# Patient Record
Sex: Female | Born: 1950 | Race: White | Hispanic: No | Marital: Married | Smoking: Former smoker
Health system: Southern US, Community
[De-identification: ages and names within clinical notes are randomized; demographics above are authoritative.]

## PROBLEM LIST (undated history)

## (undated) DIAGNOSIS — D249 Benign neoplasm of unspecified breast: Secondary | ICD-10-CM

## (undated) DIAGNOSIS — E785 Hyperlipidemia, unspecified: Secondary | ICD-10-CM

## (undated) DIAGNOSIS — E119 Type 2 diabetes mellitus without complications: Secondary | ICD-10-CM

## (undated) HISTORY — PX: HIP ARTHROSCOPY: SUR88

## (undated) HISTORY — PX: EYE SURGERY: SHX253

## (undated) HISTORY — PX: TUBAL LIGATION: SHX77

---

## 1987-10-20 HISTORY — PX: BREAST EXCISIONAL BIOPSY: SUR124

## 1998-09-25 ENCOUNTER — Other Ambulatory Visit: Admission: RE | Admit: 1998-09-25 | Discharge: 1998-09-25 | Payer: Self-pay | Admitting: *Deleted

## 1998-09-26 ENCOUNTER — Other Ambulatory Visit: Admission: RE | Admit: 1998-09-26 | Discharge: 1998-09-26 | Payer: Self-pay | Admitting: *Deleted

## 2005-10-19 HISTORY — PX: BREAST EXCISIONAL BIOPSY: SUR124

## 2005-12-10 ENCOUNTER — Ambulatory Visit: Payer: Self-pay | Admitting: Family Medicine

## 2005-12-25 ENCOUNTER — Ambulatory Visit: Payer: Self-pay | Admitting: Family Medicine

## 2006-03-04 ENCOUNTER — Ambulatory Visit: Payer: Self-pay | Admitting: Gastroenterology

## 2006-07-28 ENCOUNTER — Ambulatory Visit: Payer: Self-pay | Admitting: General Surgery

## 2006-08-20 ENCOUNTER — Ambulatory Visit: Payer: Self-pay | Admitting: General Surgery

## 2006-12-27 ENCOUNTER — Ambulatory Visit: Payer: Self-pay | Admitting: General Surgery

## 2007-03-14 ENCOUNTER — Ambulatory Visit: Payer: Self-pay | Admitting: Family Medicine

## 2007-12-29 ENCOUNTER — Ambulatory Visit: Payer: Self-pay | Admitting: Family Medicine

## 2008-03-05 ENCOUNTER — Ambulatory Visit: Payer: Self-pay | Admitting: Ophthalmology

## 2008-05-21 ENCOUNTER — Ambulatory Visit: Payer: Self-pay | Admitting: Ophthalmology

## 2009-09-04 ENCOUNTER — Ambulatory Visit: Payer: Self-pay | Admitting: Family Medicine

## 2010-12-03 ENCOUNTER — Ambulatory Visit: Payer: Self-pay | Admitting: Family Medicine

## 2013-02-13 ENCOUNTER — Inpatient Hospital Stay: Payer: Self-pay | Admitting: Specialist

## 2013-02-13 LAB — CBC
HCT: 41.1 % (ref 35.0–47.0)
HGB: 14.1 g/dL (ref 12.0–16.0)
MCH: 30.4 pg (ref 26.0–34.0)
MCHC: 34.4 g/dL (ref 32.0–36.0)
Platelet: 287 10*3/uL (ref 150–440)
RBC: 4.66 10*6/uL (ref 3.80–5.20)
RDW: 12.7 % (ref 11.5–14.5)

## 2013-02-13 LAB — BASIC METABOLIC PANEL
Calcium, Total: 9.3 mg/dL (ref 8.5–10.1)
Chloride: 102 mmol/L (ref 98–107)
Co2: 26 mmol/L (ref 21–32)
Glucose: 139 mg/dL — ABNORMAL HIGH (ref 65–99)
Potassium: 4.3 mmol/L (ref 3.5–5.1)
Sodium: 137 mmol/L (ref 136–145)

## 2013-02-13 LAB — PROTIME-INR: INR: 1

## 2013-02-14 LAB — CBC WITH DIFFERENTIAL/PLATELET
Basophil %: 0.6 %
Eosinophil #: 0.1 10*3/uL (ref 0.0–0.7)
Eosinophil %: 0.5 %
HGB: 12.6 g/dL (ref 12.0–16.0)
Lymphocyte %: 12.5 %
MCH: 30.8 pg (ref 26.0–34.0)
MCV: 87 fL (ref 80–100)
Monocyte #: 0.6 x10 3/mm (ref 0.2–0.9)
Monocyte %: 5.3 %
Neutrophil #: 9.7 10*3/uL — ABNORMAL HIGH (ref 1.4–6.5)
Platelet: 272 10*3/uL (ref 150–440)
WBC: 11.9 10*3/uL — ABNORMAL HIGH (ref 3.6–11.0)

## 2013-02-14 LAB — BASIC METABOLIC PANEL
Anion Gap: 7 (ref 7–16)
BUN: 15 mg/dL (ref 7–18)
Co2: 27 mmol/L (ref 21–32)
Glucose: 131 mg/dL — ABNORMAL HIGH (ref 65–99)
Osmolality: 273 (ref 275–301)
Potassium: 4.1 mmol/L (ref 3.5–5.1)
Sodium: 135 mmol/L — ABNORMAL LOW (ref 136–145)

## 2013-02-15 LAB — HEMOGLOBIN: HGB: 12.8 g/dL (ref 12.0–16.0)

## 2014-04-03 ENCOUNTER — Ambulatory Visit: Payer: Self-pay | Admitting: Specialist

## 2014-04-18 ENCOUNTER — Ambulatory Visit: Payer: Self-pay | Admitting: Specialist

## 2014-04-18 LAB — URINALYSIS, COMPLETE
Bacteria: NONE SEEN
Bilirubin,UR: NEGATIVE
Blood: NEGATIVE
Glucose,UR: NEGATIVE mg/dL (ref 0–75)
KETONE: NEGATIVE
Nitrite: NEGATIVE
Ph: 6 (ref 4.5–8.0)
Protein: NEGATIVE
RBC,UR: 1 /HPF (ref 0–5)
Specific Gravity: 1.008 (ref 1.003–1.030)
Squamous Epithelial: 6
WBC UR: 7 /HPF (ref 0–5)

## 2014-04-18 LAB — BASIC METABOLIC PANEL
Anion Gap: 10 (ref 7–16)
BUN: 16 mg/dL (ref 7–18)
Calcium, Total: 9.7 mg/dL (ref 8.5–10.1)
Chloride: 102 mmol/L (ref 98–107)
Co2: 25 mmol/L (ref 21–32)
Creatinine: 0.79 mg/dL (ref 0.60–1.30)
EGFR (Non-African Amer.): >60
Glucose: 105 mg/dL — ABNORMAL HIGH (ref 65–99)
OSMOLALITY: 275 (ref 275–301)
POTASSIUM: 3.9 mmol/L (ref 3.5–5.1)
SODIUM: 137 mmol/L (ref 136–145)

## 2014-04-18 LAB — CBC
HCT: 42.3 % (ref 35.0–47.0)
HGB: 14 g/dL (ref 12.0–16.0)
MCH: 29.9 pg (ref 26.0–34.0)
MCHC: 33.1 g/dL (ref 32.0–36.0)
MCV: 90 fL (ref 80–100)
PLATELETS: 310 10*3/uL (ref 150–440)
RBC: 4.69 10*6/uL (ref 3.80–5.20)
RDW: 12.7 % (ref 11.5–14.5)
WBC: 8.4 10*3/uL (ref 3.6–11.0)

## 2014-04-18 LAB — PROTIME-INR
INR: 0.9
Prothrombin Time: 12.3 secs (ref 11.5–14.7)

## 2014-04-18 LAB — MRSA PCR SCREENING

## 2014-04-25 ENCOUNTER — Inpatient Hospital Stay: Payer: Self-pay | Admitting: Specialist

## 2014-04-26 LAB — BASIC METABOLIC PANEL
ANION GAP: 8 (ref 7–16)
BUN: 9 mg/dL (ref 7–18)
CALCIUM: 7.9 mg/dL — AB (ref 8.5–10.1)
Chloride: 103 mmol/L (ref 98–107)
Co2: 25 mmol/L (ref 21–32)
Creatinine: 0.92 mg/dL (ref 0.60–1.30)
EGFR (African American): >60
Glucose: 192 mg/dL — ABNORMAL HIGH (ref 65–99)
Osmolality: 276 (ref 275–301)
Potassium: 4 mmol/L (ref 3.5–5.1)
Sodium: 136 mmol/L (ref 136–145)

## 2014-04-26 LAB — CBC WITH DIFFERENTIAL/PLATELET
BASOS ABS: 0.1 10*3/uL (ref 0.0–0.1)
BASOS PCT: 0.5 %
EOS ABS: 0 10*3/uL (ref 0.0–0.7)
Eosinophil %: 0.1 %
HCT: 31.3 % — ABNORMAL LOW (ref 35.0–47.0)
HGB: 10.5 g/dL — AB (ref 12.0–16.0)
LYMPHS PCT: 8.3 %
Lymphocyte #: 0.9 10*3/uL — ABNORMAL LOW (ref 1.0–3.6)
MCH: 31 pg (ref 26.0–34.0)
MCHC: 33.5 g/dL (ref 32.0–36.0)
MCV: 93 fL (ref 80–100)
MONO ABS: 0.9 x10 3/mm (ref 0.2–0.9)
Monocyte %: 8.3 %
NEUTROS ABS: 9.2 10*3/uL — AB (ref 1.4–6.5)
Neutrophil %: 82.8 %
Platelet: 231 10*3/uL (ref 150–440)
RBC: 3.37 10*6/uL — AB (ref 3.80–5.20)
RDW: 12.6 % (ref 11.5–14.5)
WBC: 11.2 10*3/uL — ABNORMAL HIGH (ref 3.6–11.0)

## 2014-04-27 LAB — PATHOLOGY REPORT

## 2014-04-27 LAB — HEMOGLOBIN: HGB: 10 g/dL — ABNORMAL LOW (ref 12.0–16.0)

## 2014-04-28 LAB — URINALYSIS, COMPLETE
BACTERIA: NONE SEEN
BILIRUBIN, UR: NEGATIVE
Blood: NEGATIVE
GLUCOSE, UR: NEGATIVE mg/dL (ref 0–75)
Ketone: NEGATIVE
Leukocyte Esterase: NEGATIVE
Nitrite: NEGATIVE
PROTEIN: NEGATIVE
Ph: 8 (ref 4.5–8.0)
RBC,UR: 1 /HPF (ref 0–5)
SPECIFIC GRAVITY: 1.003 (ref 1.003–1.030)
WBC UR: 1 /HPF (ref 0–5)

## 2014-04-28 LAB — COMPREHENSIVE METABOLIC PANEL
ALK PHOS: 59 U/L
ALT: 23 U/L (ref 12–78)
AST: 26 U/L (ref 15–37)
Albumin: 2.7 g/dL — ABNORMAL LOW (ref 3.4–5.0)
Anion Gap: 10 (ref 7–16)
BUN: 8 mg/dL (ref 7–18)
Bilirubin,Total: 0.7 mg/dL (ref 0.2–1.0)
CALCIUM: 8.5 mg/dL (ref 8.5–10.1)
CO2: 24 mmol/L (ref 21–32)
CREATININE: 0.66 mg/dL (ref 0.60–1.30)
Chloride: 105 mmol/L (ref 98–107)
GLUCOSE: 169 mg/dL — AB (ref 65–99)
Osmolality: 280 (ref 275–301)
POTASSIUM: 3.7 mmol/L (ref 3.5–5.1)
Sodium: 139 mmol/L (ref 136–145)
Total Protein: 6.7 g/dL (ref 6.4–8.2)

## 2014-04-28 LAB — SODIUM, URINE, RANDOM: SODIUM, URINE RANDOM: 30 mmol/L (ref 20–110)

## 2014-04-28 LAB — OSMOLALITY: OSMOLALITY: 286 mosm/kg (ref 280–301)

## 2014-04-28 LAB — OSMOLALITY, URINE: Osmolality: 119 mOsm/kg

## 2014-11-27 ENCOUNTER — Ambulatory Visit: Payer: Self-pay | Admitting: Family Medicine

## 2014-12-14 ENCOUNTER — Ambulatory Visit: Payer: Self-pay | Admitting: Family Medicine

## 2015-02-08 NOTE — Consult Note (Signed)
PATIENT NAME:  Kathryn Yates, MALMQUIST MR#:  [id number] DATE OF BIRTH:  06-01-1951  DATE OF CONSULTATION:  02/13/2013  REFERRING PHYSICIAN:  Earnestine Leys, MD CONSULTING PHYSICIAN:  Belia Heman. Verdell Carmine, MD PRIMARY CARE PHYSICIAN: Juluis Pitch, MD   CHIEF COMPLAINT: Preoperative evaluation and medical management.   HISTORY OF PRESENT ILLNESS: This is a 64 year old female who presents to the hospital today after suffering a fall and noted to have a right hip fracture. The patient is being admitted under orthopedic services. Hospitalist services were contacted for preoperative medical evaluation and medical management. The patient denied any prodromal symptoms prior to the fall. She said her legs gave way and she fell. She was brought to the ER and noted to have a right femoral neck fracture. The patient denies any chest pain, any shortness of breath, any nausea, vomiting, abdominal pain, fevers, chills, cough or any other associated symptoms presently.   REVIEW OF SYSTEMS:   CONSTITUTIONAL: No documented fever. No weight gain, no weight loss.  EYES: No blurred or double vision.  ENT: No tinnitus. No postnasal drip. No redness of the oropharynx.  RESPIRATORY: No cough, no wheeze, no hemoptysis, no dyspnea.  CARDIOVASCULAR: No chest pain, no orthopnea, no palpitations, no syncope.  GASTROINTESTINAL: No nausea. No vomiting, no diarrhea, no abdominal pain, no melena or hematochezia.  GENITOURINARY: No dysuria or hematuria.  ENDOCRINE: No polyuria or nocturia, heat or cold intolerance.  HEMATOLOGIC: No anemia. No bruising, no bleeding.  INTEGUMENTARY: No rashes. No lesions.  MUSCULOSKELETAL: No arthritis, no swelling, no gout.  NEUROLOGIC: No numbness or tingling. No ataxia. No seizure-type activity.  PSYCHIATRIC: No anxiety. No insomnia. No ADD.   PAST MEDICAL HISTORY: Consistent with diabetes, hyperlipidemia.   ALLERGIES: No known drug allergies.   SOCIAL HISTORY: No smoking. No alcohol abuse. No  illicit drug abuse. She lives at home with her husband.   FAMILY HISTORY: The patient's father died from an MI at age 2.  Mother died from complications of congestive heart failure at age 6. She also had colon cancer and breast cancer.   CURRENT MEDICATIONS: Aleve 220 mg 2 tabs b.i.d. as needed for pain, metformin 5 mg b.i.d., simvastatin 20 mg at bedtime, vitamin B12 1 tab daily, Zyrtec 10 mg daily.   PHYSICAL EXAMINATION: VITAL SIGNS: Presently, temperature is 97.8, pulse 68, respirations 20, blood pressure 121/59, sats 93% on room air.  GENERAL: She is a pleasant-appearing female in no apparent distress.  HEENT: Atraumatic, normocephalic. Extraocular muscles are intact. Pupils are equal and reactive to light. Sclerae are anicteric. No conjunctival injection. No pharyngeal erythema.  NECK: Supple. No jugular venous distention, no bruits, no lymphadenopathy, no thyromegaly.  HEART: Regular rate and rhythm. No murmurs. No rubs. No clicks.  LUNGS: Clear to auscultation bilaterally. No rales, no rhonchi, no wheezes.  ABDOMEN: Soft, flat, nontender, nondistended. Has good bowel sounds. No hepatosplenomegaly appreciated.  EXTREMITIES: No evidence of any cyanosis, clubbing or peripheral edema. Has +2 pedal and radial pulses bilaterally. Her right lower extremity is shortened and externally rotated due to her fracture.  NEUROLOGIC: She is alert, awake and oriented x3 with no focal motor or sensory deficits appreciated bilaterally.  SKIN: Moist and warm with no rashes appreciated.  LYMPHATIC: There is no cervical or axillary lymphadenopathy.   LABORATORY, DIAGNOSTIC AND RADIOLOGICAL DATA:  Serum glucose 139, BUN 18, creatinine 0.8, sodium 137, potassium 4.3, chloride 102, bicarbonate 26. White cell count 14.6, hemoglobin 14.1, hematocrit 41.1, platelet count 287. INR is 1.0.  The patient did have an x-ray of the pelvis showing a subcapital fracture of the right hip. The patient also had a chest  x-ray done showing no evidence of acute cardiopulmonary disease. The patient had a CT of the right hip showing displaced fracture of the base of the right femoral neck. The patient's EKG shows normal sinus rhythm with normal axis and no evidence of any acute ST or T wave changes.   ASSESSMENT AND PLAN: This is a 64 year old female with a history of diabetes, hyperlipidemia, who presents to the hospital after a fall and noted to have a right hip fracture.   1.  Preoperative evaluation:  The patient is likely a low risk for noncardiac surgery. No absolute contraindications to surgery at this time. The patient's EKG has been reviewed, shows no acute ST or T wave changes. I would proceed with surgery.  2.  Diabetes:  I will continue the patient's metformin, place her on sliding scale insulin for now, carb-controlled diet. Follow blood sugars.  3.  Hyperlipidemia:  Continue with her simvastatin.  4.  Status post fall and right femoral neck fracture:  Continue care as per orthopedics.   CODE STATUS:  The patient is a FULL CODE.    Thank you so much for the consultation. We will follow along with you.   TIME SPENT: 45 minutes   ____________________________ Belia Heman. Verdell Carmine, MD vjs:cb D: 02/13/2013 15:09:16 ET T: 02/13/2013 15:27:05 ET JOB#: [id number]  cc: Belia Heman. Verdell Carmine, MD, <Dictator> Henreitta Leber MD ELECTRONICALLY SIGNED 02/14/2013 19:39

## 2015-02-08 NOTE — Discharge Summary (Signed)
PATIENT NAME:  Kathryn Yates, Kathryn Yates MR#:  [id number] DATE OF BIRTH:  08-13-1951  DATE OF ADMISSION:  02/13/2013 DATE OF DISCHARGE:  02/17/2013   FINAL DIAGNOSES:  1.  Impacted right subcapital hip fracture. 2.  Diabetes.  3.  Hyperlipidemia.   OPERATIONS: On 02/13/2013, percutaneous pinning of the right subcapital hip fracture.   CONSULTATIONS: [facility].   COMPLICATIONS: None.   DISCHARGE MEDICATIONS: Enteric-coated aspirin 1 p.o. b.i.d. for 6 weeks, Norco 5/325 p.r.n. pain.   HOME MEDICATIONS: To include Zyrtec p.r.n., vitamin B12 daily, simvastatin 20 mg daily, metformin 500 mg b.i.d., Aleve as needed.   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 She injured her right hip. She was brought to the Emergency Room where exam and x-rays, along with CT scan, showed an essentially nondisplaced right subcapital femoral neck fracture. She was admitted for operative fixation and medical clearance. CT scan showed the fracture to be minimally displaced if at all. I advised the patient and her husband I felt that pinning of the fracture to try and maintain the native femoral head at her age was indicated. The risks of nonunion, avascular necrosis and other problems were discussed with them at length. Option of hemiarthroplasty was also discussed. After discussion, we all agreed that percutaneous pinning with knowing that there was a possibility of further surgery down the road was indicated.   PAST MEDICAL HISTORY: ILLNESSES: As above.   MEDICATIONS: As above.   ALLERGIES: None.  REVIEW OF SYSTEMS: Unremarkable.   FAMILY HISTORY: Unremarkable.   SOCIAL HISTORY: The patient works and lives with her husband. She does not smoke or drink.   PHYSICAL EXAMINATION: Healthy female in no distress. The right leg was not shortened. She had pain with movement of the hip. Neurovascular status was good.    IMAGING STUDIES: X-rays as above.   HOSPITAL COURSE: After medical clearance, the patient was taken to surgery later the same day. She underwent percutaneous pinning of the hip with excellent position of the fracture and pins. Postoperatively, she did well with minimal pain. Her main problem was there was difficulty raising the right leg to go up and down stairs. We need to keep her touch-down weight-bearing for 6 weeks to prevent over-compressing the fracture. The case managers worked on skilled nursing facilities, but apparently this was not an available option. She was discharged home on 02/17/2013. She will get home health PT and remain touch-down weight-bearing status. She will return to my office in 2 weeks for exam and x-ray and remain out of work.  ____________________________ Park Breed, MD hem:jm D: 02/17/2013 12:56:35 ET T: 02/17/2013 13:31:34 ET JOB#: [id number]  cc: Park Breed, MD, <Dictator> Youlanda Roys. Lovie Macadamia, [place] MD ELECTRONICALLY SIGNED 02/18/2013 11:42

## 2015-02-08 NOTE — H&P (Signed)
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  Brought to Emergency Room where exam and X-rays / ct scan show an essentially nondisplaced right femoral neck fracture. Have discussed options with patient and have recommended surgery which she agrees with.  In addition, I have recommended a percutaneous pinning as the fracture is not significantly displaced on ct scan and I feel that preservation of her native hip at her age is advantageous.  Have discussed risks of non union and AVN and need for later surgery but she agrees with the pinning instead of hemiarthroplasty.  Risks and benefits of surgery were discussed at length including but not limited to infection, non union, nerve or blood vessed damage, non union, need for repeat surgery, blood clots and lung emboli, and death. Cleared for surgery by medical service.   Past Med/Surgical Hx:  Hypercholesterolemia:   Diabetes Mellitus, Type II (NIDD):   ALLERGIES:  No Known Allergies:   Family and Social History:  Family History Non-Contributory   Social History negative tobacco   Place of Living Home   Review of Systems:  Fever/Chills No   Cough No   Sputum No   Abdominal Pain No   Physical Exam:  GEN well developed, well nourished, no acute distress   HEENT pink conjunctivae   NECK supple   RESP normal resp effort   CARD regular rate   ABD denies tenderness   GU foley catheter in place   LYMPH negative neck   EXTR negative edema, Pain with range of motion of right hip.  No shortening.  circulation/sensation/motor function good.   SKIN normal to palpation   NEURO motor/sensory function intact   PSYCH alert, A+O to time, place, person, good insight   Lab Results: Routine BB:  28-Apr-14 12:03   ABO Group + Rh Type O Positive  Antibody Screen NEGATIVE (Result(s) reported on 13 Feb 2013 at 02:33PM.)  Routine Chem:  28-Apr-14 12:03   Glucose, Serum  139  BUN 18  Creatinine (comp) 0.81  Sodium, Serum 137  Potassium, Serum 4.3  Chloride, Serum 102  CO2, Serum 26  Calcium (Total), Serum 9.3  Anion Gap 9 (Result(s) reported on 13 Feb 2013 at 12:29PM.)  Osmolality (calc) 278  Routine Coag:  28-Apr-14 12:03   Prothrombin 13.2  INR 1.0 (INR reference interval applies to patients on anticoagulant therapy. A single INR therapeutic range for coumarins is not optimal for all indications; however, the suggested range for most indications is 2.0 - 3.0. Exceptions to the INR Reference Range may include: Prosthetic heart valves, acute myocardial infarction, prevention of myocardial infarction, and combinations of aspirin and anticoagulant. The need for a higher or lower target INR must be assessed individually. Reference: The Pharmacology and Management of the Vitamin K  antagonists: the seventh ACCP Conference on Antithrombotic and Thrombolytic Therapy. TLXBW.6203 Sept:126 (3suppl): [id number]. A HCT value >55% may artifactually increase the PT.  In one study,  the increase was an average of 25%. Reference:  "Effect on Routine and Special Coagulation Testing Values of Citrate Anticoagulant Adjustment in Patients with High HCT Values." American Journal of Clinical Pathology 2006;126:400-405.)  Routine Hem:  28-Apr-14 12:03   WBC (CBC)  14.6  RBC (CBC) 4.66  Hemoglobin (CBC) 14.1  Hematocrit (CBC) 41.1  Platelet Count (CBC) 287 (Result(s) reported on 13 Feb 2013 at 12:26PM.)  MCV 88  MCH 30.4  MCHC 34.4  RDW 12.7   Radiology Results: XRay:    28-Apr-14 11:46, Hip Right Complete  Hip Right Complete  REASON FOR EXAM:    fall, pain  COMMENTS:       PROCEDURE: DXR - DXR HIP RIGHT COMPLETE  - Feb 13 2013 11:46AM     RESULT: AP and lateral views of the right hip reveal the patient to have   sustained a subcapital fracture of the right hip. There is some    shortening of the extremity. The visualized portions of the right   hemipelvis appear normal.    IMPRESSION:  The patient has sustained an acute subcapital fracture of   the right hip.     Dictation Site: 2    Verified By: Kathryn Yates, M.D.,MD    28-Apr-14 11:46, Pelvis AP Only  Pelvis AP Only  REASON FOR EXAM:    pain right hip  COMMENTS:       PROCEDURE: DXR - DXR PELVIS AP ONLY  - Feb 13 2013 11:46AM     RESULT: The bony pelvis is adequately mineralized. There is lucency that   projects over the parasymphyseal portion of the left superior and   inferior pubic rami this likely lies within bowel. No cortical disruption   is demonstrated. The SI joints and the observed portions of the sacrum   appear normal. The left hip is normal in appearance. There is a   subcapital fracture of the right hip. There are phleboliths within the   pelvis.    IMPRESSION:  There is no evidence of acute fracture of the bony pelvis in   this patient with an acute subcapital fracture of the right hip.   Dictation Site: 2        Verified By: Kathryn Yates, M.D., MD  LabUnknown:    28-Apr-14 11:46, Hip Right Complete  PACS Image    28-Apr-14 11:46, Pelvis AP Only  PACS Image    28-Apr-14 13:56, CT Hip Right Without Contrast  PACS Image  CT:  CT Hip Right Without Contrast  REASON FOR EXAM:    evaluate fx pattern  COMMENTS:       PROCEDURE: CT  - CT HIP RIGHT WITHOUT CONTRAST  - Feb 13 2013  1:56PM     RESULT: Comparison: Radiographs performed same day.    Technique: Multiple axial images were obtained of the pelvis, without   intravenous contrast. Coronal and sagittal reformats were performed.    Findings:  There is a displaced fracture of the right hip at the base of the femoral   neck, just along the margin with the intertrochanteric region. The   fracture line appears just superior medial to the lesser trochanter and   just medial to the greater trochanter.  The bladder is  decompressed with a Foley catheter. Tiny sclerotic density   in the right iliac wing likely represents a bone island.    IMPRESSION:   Displaced fracture of the base of the right femoral neck.        Verified By: Kathryn Yates, M.D., MD    Assessment/Admission Diagnosis Minimally displaced right subcapital hip fracture.   Plan Percutaneous pinng right hip.   Electronic Signatures: Kathryn Yates (MD)  (Signed 28-Apr-14 16:39)  Authored: CHIEF COMPLAINT and HISTORY, PAST MEDICAL/SURGIAL HISTORY, ALLERGIES, FAMILY AND SOCIAL HISTORY, REVIEW OF SYSTEMS, PHYSICAL EXAM, LABS, Radiology, ASSESSMENT AND PLAN   Last Updated: 28-Apr-14 16:39 by Kathryn Yates,  Kathryn Yates (MD)

## 2015-02-08 NOTE — Op Note (Signed)
PATIENT NAME:  Kathryn Yates, Kathryn Yates MR#:  [id number] DATE OF BIRTH:  [birth year]  DATE OF PROCEDURE:  02/13/2013  PREOPERATIVE DIAGNOSIS: Minimally displaced subcapital fracture, right hip.   POSTOPERATIVE DIAGNOSIS: Minimally displaced subcapital fracture, right hip.  OPERATION: Percutaneous pinning, right subcapital hip fracture.   SURGEON: Park Breed, MD   ANESTHESIA: General endotracheal.   COMPLICATIONS: None.   DRAINS: None.   ESTIMATED BLOOD LOSS: Minimal.   DESCRIPTION OF PROCEDURE: The patient was brought to the operating room where she underwent satisfactory general endotracheal anesthesia in the supine position at her request. The  left leg was flexed and abducted, and the right leg was placed in minimal traction and internal rotation. Fluoroscopy showed the fracture to remain in excellent alignment on AP and lateral view with no angulation or displacement. The hip was prepped and draped in sterile fashion, and 4 stab wounds made laterally. The 4 guide pins were inserted under fluoroscopic control, and four 7.3 mm cannulated long thread Synthes screws were inserted under fluoroscopic control. Traction was released prior to putting the screws in. The pins were all tightened snugly and the guide pins removed. The fluoroscopy showed all 4 screws to be in excellent position, and the fracture was in excellent alignment on AP and lateral view. The stab wounds were closed with 3-0 nylon suture and a dry sterile dressing applied.        The patient was transferred to her hospital bed and taken to recovery in good condition with good motion of the hip without crepitus.  ____________________________ Park Breed, MD hem:cb D: 02/13/2013 19:01:09 ET T: 02/13/2013 22:08:04 ET JOB#: [id number]  cc: Park Breed, MD, <Dictator> Park Breed MD ELECTRONICALLY SIGNED 02/14/2013 12:52

## 2015-02-08 NOTE — Consult Note (Signed)
Brief Consult Note: Diagnosis: 1. Pre-operative eval 2. DM 3. Hyperlipidemia.   Patient was seen by consultant.   Consult note dictated.   Recommend to proceed with surgery or procedure.   Orders entered.   Comments: 64 yo female w/ hx of DM, Hyperlipidemia came into hospital after a fall and noted to have a right hip fracture.   1. Pre-operative eval - pt. is likely a low risk for non-cardiac surgery.  - no contraindications to surgery at this time.  '- ECG reviewed  2. DM - cont. Metformin. SSI - carb control diet.   3. Hyperlipidemia - cont. Simvastatin.   4. s/p fall and right hip fracture - cont. care as per Ortho.   Full Code Thanks for the consult and will follow with you.  Job # [id number].  Electronic Signatures: Henreitta Leber (MD)  (Signed 28-Apr-14 15:09)  Authored: Brief Consult Note   Last Updated: 28-Apr-14 15:09 by Henreitta Leber (MD)

## 2015-02-09 NOTE — Discharge Summary (Signed)
PATIENT NAME:  Kathryn Yates, Kathryn Yates MR#:  [id number] DATE OF BIRTH:  12-21-1950  DATE OF ADMISSION:  04/25/2014 DATE OF DISCHARGE:  04/28/2014  FINAL DIAGNOSES:  1. Avascular necrosis, right hip, following femoral neck fracture 2 years ago. 2. Painful hardware, right hip.  3. Hypercholesterolemia.  4. Diabetes mellitus, type 2.   OPERATIONS:  1. DePuy AML cementless total hip replacement.  2. Removal of prior hardware, right hip.   COMPLICATIONS: None.   CONSULTATIONS: Dr. Anthonette Legato from nephrology.   DISCHARGE MEDICATIONS: Norco 5/325 p.r.n. pain, enteric-coated aspirin 1 p.o. b.i.d., Neurontin 400 mg b.i.d.   HISTORY OF PRESENT ILLNESS: The patient is a 64 year old female who suffered a right subcapital hip fracture in 2013, which was pinned percutaneously. The fracture healed, but she eventually developed increased pain in the hip joint. She also had bursitis over the hardware. X-rays and MRI revealed avascular necrosis of the femoral head with early collapse changes. She also had bursitis over the hardware. After discussion with the patient, it was elected to proceed with hardware removal and total hip replacement in order to prevent 2 procedures. The risks and benefits were discussed with her and her husband at length.   PAST MEDICAL HISTORY: Illnesses as above.   HOME MEDICATIONS: Simvastatin 20 mg daily, metformin 500 mg b.i.d., meloxicam 15 mg daily, calcium with vitamin D.   ALLERGIES: None.   REVIEW OF SYSTEMS: Unremarkable.   FAMILY HISTORY: Unremarkable.   SOCIAL HISTORY: The patient lives at home with her husband. She still works.   PHYSICAL EXAMINATION: Healthy female in no acute distress. She has an antalgic gait on the right. Her right hip had pain with range of motion. Internal rotation to 10 degrees, external rotation is to 25 degrees. She had good flexion. She is very tender over the hip hardware.   LABORATORY DATA: On admission was satisfactory.   HOSPITAL  COURSE: On 04/25/2014, the patient underwent hardware removal and right total hip replacement with a DePuy AML cementless hip prosthesis. Postoperatively she did well. She was noted to have some polyuria on the 2nd and 3rd postoperative day. She was seen by Dr. Holley Raring. She had a similar episode after her previous surgery and it was felt that this was not a significant problem. She was discharged on 04/28/2014.   She is to be partial weight-bearing on the right leg and be seen in my office in 2 weeks for examination.   ____________________________ Park Breed, MD hem:jr D: 05/23/2014 07:55:51 ET T: 05/23/2014 08:59:58 ET JOB#: [id number]  cc: Park Breed, MD, <Dictator> Youlanda Roys. Lovie Macadamia, MD Park Breed MD ELECTRONICALLY SIGNED 05/23/2014 13:17

## 2015-02-09 NOTE — Op Note (Signed)
PATIENT NAME:  Kathryn Yates, Kathryn Yates MR#:  [id number] DATE OF BIRTH:  05-06-1951  DATE OF PROCEDURE:  04/25/2014  PREOPERATIVE DIAGNOSIS: Avascular necrosis, right hip with early degenerative arthritis, secondary to previous femoral neck fracture.   POSTOPERATIVE DIAGNOSIS:  Avascular necrosis, right hip with early degenerative arthritis, secondary to previous femoral neck fracture.  OPERATION: Right hip DePuy AML cementless total hip replacement (13.5 mm narrow femoral stem, 52 mm series 300 acetabular cup, polyethylene liner with +4 depth and 10 mm lip, 36 mm metal head, -2 mm neck length).   SURGEON: Park Breed, M.D.   ASSISTANT: Dr. Mack Guise and Morley Kos, [employer] student.   ANESTHESIA: Spinal with narcotics plus general endotracheal.  COMPLICATIONS: None.   DRAINS: Two Autovacs.   ESTIMATED BLOOD LOSS: 300 mL.   REPLACED: None.   DESCRIPTION OF PROCEDURE: The patient was brought to the Operating Room where she underwent satisfactory spinal anesthesia with narcotics, as well as general endotracheal anesthesia. She was turned and placed on the operating table and turned in the left lateral decubitus position and padded appropriately and stabilized with hip grips. The right hip was prepped and draped in a sterile fashion and a posterolateral incision made. Dissection was carried out sharply through subcutaneous tissue down to fascia. The fascia was incised and the Charnley retractor was inserted. Blunt dissection was carried out initially over the short external rotators. Electrocautery was used for hemostasis at all levels. The piriformis and gemelli were released and tagged. The quadratus was elevated and retractors inserted. The posterior capsule was incised at a T fashion and the hip was easily dislocated. Did not want to take the pins out until we had dislocated the hip one time to prevent increased torque. The hip was then relocated and the four Synthes 7.3 screws were removed without  difficulty. The hip was then re-dislocated and the femoral head amputated with an oscillating saw. Retractors were inserted around the acetabulum and the labrum was removed. The acetabulum was then reamed sequentially from 46 mm to 51 mm.  A 52 mm trial socket was inserted and fit well with good rim fit. This was removed and a series 352 mm cup was then implanted in about 20 degrees of anteversion and 50 degrees of inclination. The trial liner was inserted. The femoral canal was then reamed to 13 mm without difficulty and rasped from up to 13.5 mm. Care was taken at all levels to make sure we did not cracked the bone at all. A trial reduction was carried out. The standard neck lengths appeared to be appropriate to that point. The hip was dislocated and trials  removed. The [facility] was placed in the acetabular cup and a polyethylene liner was inserted with a 10 mm lip posterior/superior.  The 13.5 narrow femoral stem was inserted and fit very snugly. It remained somewhat prouder than the initial rasp had been, so I elected to downsize the neck length 1 to a -2. A 36 mm head with a -2 neck length was inserted and the hip reduced very nicely.  Leg lengths were excellent. There was good stability in all directions. There was no pistoning. The wounds were then irrigated. Autovacs were inserted. The capsule was repaired with #2 Tycron as were the external rotators. The capsule was closed with #2  Quill over one Autovac and the subcutaneous tissue was closed with 0 Quill over another Autovac. Irrigation was carried out at each level. The skin was closed with staples. TENS pads and  dry sterile dressing were applied. The Autovac was activated. The patient was carefully transferred onto her back in her hospital bed. The hip was stable. Leg lengths were excellent. She was awakened and taken to recovery in good condition.    ____________________________ Park Breed, MD hem:ts D: 04/25/2014 10:37:35  ET T: 04/25/2014 13:53:22 ET JOB#: [id number]  cc: Park Breed, MD, <Dictator> Park Breed MD ELECTRONICALLY SIGNED 04/26/2014 10:26

## 2015-02-09 NOTE — Consult Note (Signed)
PATIENT NAME:  Kathryn, Yates MR#:  [id number] DATE OF BIRTH:  1951-06-18  DATE OF CONSULTATION:  04/28/2014  CONSULTING PHYSICIAN:  Albertine Patricia, MD  REFERRING PHYSICIAN: Timoteo Gaul, MD  CHIEF COMPLAINT: Polyuria.   HISTORY OF PRESENT ILLNESS: This is a 64 year old female with a known history of diabetes mellitus, hyperlipidemia. The patient was admitted on July 8th for elective right hip replacement as she is having avascular necrosis of the right hip with early degenerative arthritis secondary to previous femoral neck fracture. The patient tolerated the surgery very well on 04/25/2014  . Foley catheter was discharged yesterday in the morning. On the evening shift, the patient was noticed to have significant urine output. Was measured over the last 8 hours around 2200 mL. Prior to that, when patient had Foley catheter inserted, she had normal urine output averaging 750 mL per day. The patient denies any excessive fluid intake. Reports actually over the last shift she has not been taking much fluid. She only had 200 mL of water over last 8 hours, reports prior to that she was drinking on average 1-1.5 liter of fluids per shift which is total 2-3 liters per day. She denies taking any lithium, any psychiatric history, or any previous history of hormonal abnormalities. Denies any blurry vision, double vision, dizziness, lightheadedness. The patient's most recent labs are on July 9th which show osmolality of 276, sodium of 136, potassium of 7.9, and the patient's blood sugar was within normal limits. Urinalysis does not show any glucosuria and specific gravity of 1.003. Hospitalist service requested to evaluate her for her polyuria.   PAST MEDICAL HISTORY:  1. Hyperlipidemia.  2. Diabetes mellitus.   PAST SURGICAL HISTORY: Right percutaneous pinning of right femoral neck in the past and recent ORIF surgery of the right hip on July 8th.   ALLERGIES: No known drug allergies.   SOCIAL  HISTORY: No smoking. No alcohol. No illicit drug use. Lives at home with her husband.   FAMILY HISTORY: Significant for coronary artery disease. Father died of MI at the age of 9.   HOME MEDICATIONS:  1. Meloxicam 15 mg oral daily at bedtime. 2. Tramadol 50 mg every 6 hours as needed.  3. Simvastatin 20 mg oral at bedtime.  4. Citracal plus vitamin D 1 tablet oral 2 times a day.  5. Metformin 100 mg oral 2 times a day.   REVIEW OF SYSTEMS:   GENERAL: Denies fever, chills, fatigue, weakness. EYES: Denies blurry vision, double vision, inflammation. EARS, NOSE, AND THROAT: Denies tinnitus, ear pain, hearing loss.  RESPIRATORY: Denies cough, wheezing, shortness of breath, hemoptysis.  CARDIOVASCULAR: Denies chest pain, orthopnea, edema, arrhythmia. GASTROINTESTINAL: Denies nausea, vomiting, diarrhea, abdominal pain, hematemesis. GENITOURINARY: Denies dysuria, hematuria, reports polyuria.  ENDOCRINE: Reports history of diabetes. Denies any thyroid disease. Reports history of complaints of polyuria.  HEMATOLOGY: Denies anemia, easy bruising.  INTEGUMENT: Denies acne, rash, or skin lesion.  MUSCULOSKELETAL: The patient had recent right hip ORIF surgery with minimal pain.  NEUROLOGIC: Denies CVA, TIA, ataxia, vertigo, tremors.  PSYCHIATRIC: Denies anxiety, insomnia, or depression or any psychiatric history.  CURRENT IN-HOSPITAL MEDICATION:  1. Lortab as needed.  2. Bisacodyl suppository as needed.  3. Docusate 240 mg oral at bedtime.  4. Magnesium hydroxide as needed.  5. Meloxicam 7.5 mg oral daily.  6. Metformin 500 mg oral 2 times a day.  7. Morphine 1 to 2 mg IV every 1 to 2 hours as needed.  8. Zofran as needed.  9. Phenergan as needed.  10. Simvastatin 20 mg oral at bedtime.  11. Zolpidem as needed.  12. Lovenox 30 mg oral b.i.d.   PHYSICAL EXAMINATION:  VITAL SIGNS: Temperature 99, pulse 111, respiratory rate 18, blood pressure 107/66, saturating 96% on room air.  GENERAL:  Well-nourished female who looks comfortable in bed, in no apparent distress.  HEENT: Head atraumatic, normocephalic. Pupils are equal and reactive to light. Pink conjunctivae. Anicteric sclerae. Moist oral mucosa.  NECK: Supple. No thyromegaly. No JVD.  CHEST: Good air entry bilaterally. No wheezing, rales, or rhonchi.  CARDIOVASCULAR: S1, S2 heard. No rubs, murmurs, gallops.  ABDOMEN: Soft, nontender, nondistended. Bowel sounds present.  EXTREMITIES: No edema. No clubbing. No cyanosis. Pedal and radial pulses felt bilaterally. Right foot on mild traction due to her hip surgery. MUSCULOSKELETAL: No joint effusion. SKIN: Normal skin turgor, warm and dry.   LYMPHATIC: No cervical lymphadenopathy could be appreciated.   PERTINENT LABORATORY DATA: As of July 9th, glucose 192, BUN 9, creatinine 0.92, sodium 136, potassium 4, chloride 103, CO2 of 25, osmolality 276, calcium 7.9. White blood cells 11.2, hemoglobin 10.5, repeat is 10, hematocrit 31.3, platelets 231,000. Urinalysis is negative for leukocyte esterase and nitrite. No glucose in the urine.   Four epithelial cells.   ASSESSMENT AND PLAN:  1. Polyuria. Etiology at this point is unclear, but patient so far is negative, 3000 mL for the last 24 hours. Given her most recent labs before today, her osmolality is low as well as sodium and calcium are on the lower side as well which supports psychogenic polydipsia, but as per patient, her fluid intake is not that high, so we will repeat current labs to see how her serum osmolality. As well we will check urine osmolality and urine sodium as well and re-evaluate. We will consult nephrology service as well. We will give the patient a brief trial of fluid restriction, and then we will repeat labs.  2. Hyperlipidemia. Continue with statin.  3. Diabetes mellitus controlled. Continue with metformin. 4. Deep venous thrombosis prophylaxis on Lovenox.   TOTAL TIME SPENT ON MEDICAL CONSULTATION: 50  minutes.   ____________________________ Albertine Patricia, MD dse:lt D: 04/28/2014 06:16:00 ET T: 04/28/2014 08:21:48 ET JOB#: [id number]  cc: Albertine Patricia, MD, <Dictator> DAWOOD Graciela Husbands MD ELECTRONICALLY SIGNED 04/29/2014 5:29

## 2015-02-09 NOTE — H&P (Signed)
Subjective/Chief Complaint Pain right hip   History of Present Illness 64 year old female is 15 months post inning right femoral neck fracture. Initially she did well but recently pain has increased.  She has signs of bursitis but also joint pain.  MRI shows avascular necrosis of femoral head.  I have discussed treatment with her and have agreed that pin removal alone is probably not sufficient to resolve her pain.  total hip replacement has been recommended and she agrees with this.  Risks and benefits of surgery were discussed at length including but not limited to infection, non union, nerve or blood vessed damage, non union, need for repeat surgery, blood clots and lung emboli, and death.   Past Med/Surgical Hx:  Hypercholesterolemia:   Diabetes Mellitus, Type II (NIDD):   Right Percutaneous Pinning of Right Femoral Neck:   ALLERGIES:  No Known Allergies:   Family and Social History:  Family History Non-Contributory  Negative   Social History negative tobacco   Place of Living Home   Review of Systems:  Fever/Chills No   Cough No   Sputum No   Abdominal Pain No   Physical Exam:  GEN well developed, well nourished, no acute distress   HEENT pink conjunctivae   NECK supple   RESP normal resp effort   CARD regular rate   ABD denies tenderness   LYMPH negative neck   EXTR negative edema, Right hip pain to palpation and with range of motion.  Decreased range of motion from 10*i internal rotation to 20* external.  Leg lengths equal.  circulation/sensation/motor function good distally   SKIN skin turgor poor   NEURO motor/sensory function intact   PSYCH alert, A+O to time, place, person, good insight   Radiology Results: MRI:    16-Jun-15 09:07, MRI Hip Right Without Contrast  MRI Hip Right Without Contrast  REASON FOR EXAM:    SP fracture  ABN  COMMENTS:       PROCEDURE: MR  - MR HIP RT  WO CONTRAST  - Apr 03 2014  9:07AM     CLINICAL DATA:  History of  right hip fracture with subsequent  fixation 02/13/2013. Right hip pain and stiffness. Possible  avascular necrosis.    EXAM:  MRI OF THE RIGHT HIP WITHOUT CONTRAST    TECHNIQUE:  Multiplanar, multisequence MR imaging was performed. No intravenous  contrast was administered.  COMPARISON:  CT right hip and intraoperative spot views of the right  hip 02/13/2013.    FINDINGS:  Despite using protocol for artifact reduction from hardware (MARS  protocol), there is artifact about the right hip secondary to  fixation screws. No acute fracture is identified. Serpiginous T1 and  T2 hypointense line in the right femoral head is compatible with  avascular necrosis. Visualization is limited. No obvious femoral  head fragmentation or collapse is identified. Subchondral cysts are  identified in the right acetabular roof.    The left hip, sacroiliac joints and symphysis pubis all appear  normal. No evidence of bursitis is identified. All pelvic  musculature, including the hamstring origins, appears normal. 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     IMPRESSION:  Although visualization is limited, there appears to be avascular  necrosis of the right femoral head with secondary osteoarthritis  about the right hip. The examination is otherwise negative.      Electronically Signed    By: Inge Rise M.D.  On: 04/03/2014 11:04         Verified By: Ramond Dial, M.D.,    Assessment/Admission Diagnosis Right hip pain with avascular necrosis   Plan right total hip with pin removal   Electronic Signatures: Park Breed (MD)  (Signed 07-Jul-15 18:25)  Authored: CHIEF COMPLAINT and HISTORY, PAST MEDICAL/SURGIAL HISTORY, ALLERGIES, FAMILY AND SOCIAL HISTORY, REVIEW OF SYSTEMS, PHYSICAL EXAM, Radiology, ASSESSMENT AND PLAN   Last Updated: 07-Jul-15 18:25 by Park Breed (MD)

## 2015-12-04 ENCOUNTER — Other Ambulatory Visit: Payer: Self-pay | Admitting: Family Medicine

## 2015-12-04 DIAGNOSIS — Z1231 Encounter for screening mammogram for malignant neoplasm of breast: Secondary | ICD-10-CM

## 2015-12-12 ENCOUNTER — Ambulatory Visit
Admission: RE | Admit: 2015-12-12 | Discharge: 2015-12-12 | Disposition: A | Discharge status: Home / Self Care | Payer: BLUE CROSS/BLUE SHIELD | Source: Ambulatory Visit | Attending: Family Medicine | Admitting: Family Medicine

## 2015-12-12 DIAGNOSIS — Z1231 Encounter for screening mammogram for malignant neoplasm of breast: Secondary | ICD-10-CM | POA: Diagnosis present

## 2016-02-16 IMAGING — MR MRI OF THE RIGHT HIP WITHOUT CONTRAST
5 of 7 series · 25 of 40 positions shown · non-contrast
Comparison: CT right hip and intraoperative spot views of the right
hip 02/13/2013.

CLINICAL DATA: History of right hip fracture with subsequent
fixation 02/13/2013. Right hip pain and stiffness. Possible
avascular necrosis.

EXAM:
MRI OF THE RIGHT HIP WITHOUT CONTRAST
TECHNIQUE: Multiplanar, multisequence MR imaging was performed. No intravenous
contrast was administered.

[Series 2: T1 · axial · 5.0mm · 0.74mm/px · z∈[-100,+156]mm · 7 of 42 slices shown (1 of 2)]
[im 1/42]
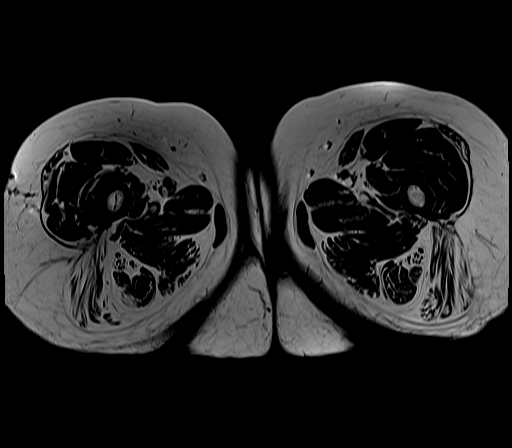
[im 7/42]
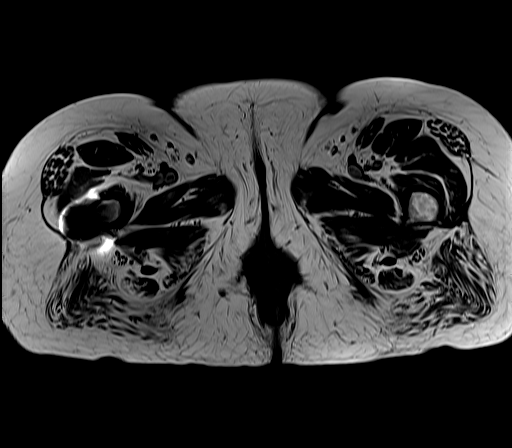
[im 14/42]
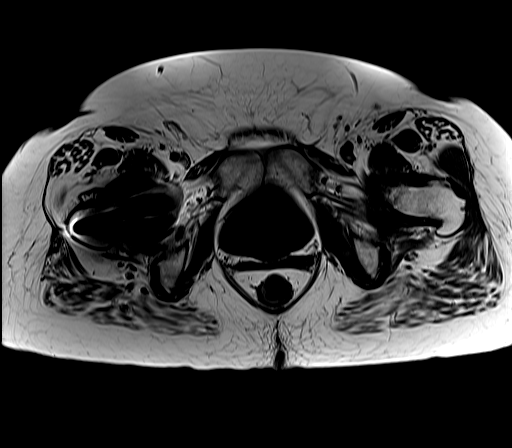
[im 21/42]
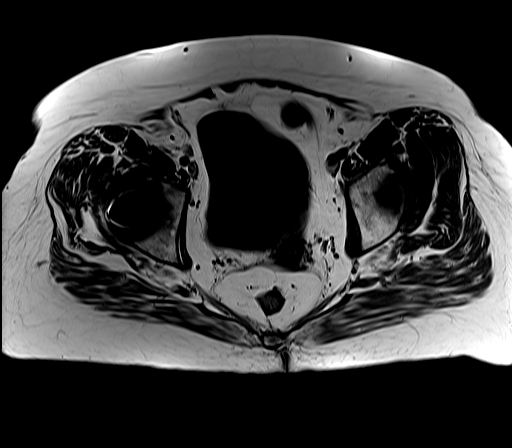
[im 28/42]
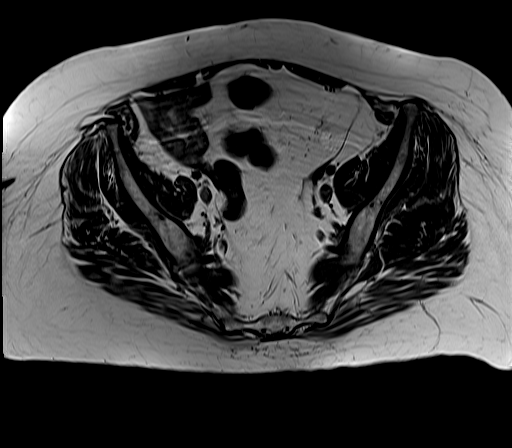
[im 35/42]
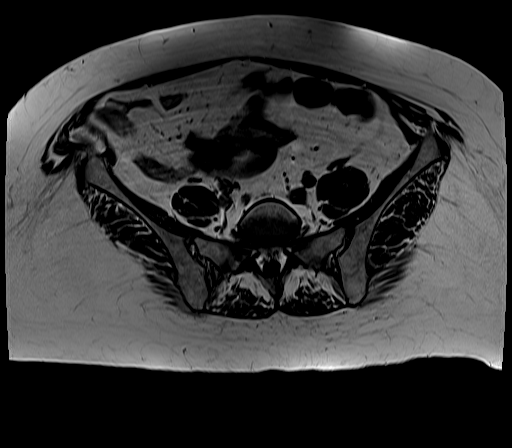
[im 42/42]
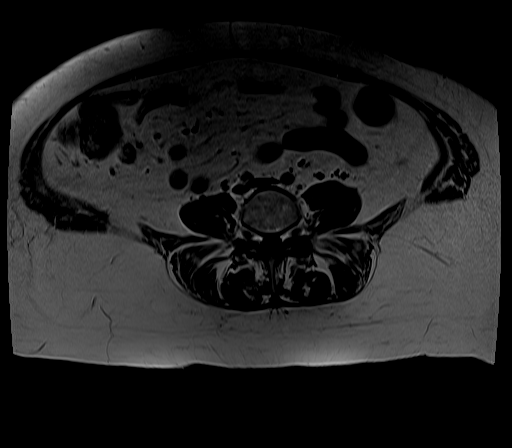

[Series 3: T1 · coronal · 5.0mm · 1.12mm/px · 4 of 25 slices shown (2 of 2)]
[im 1/25]
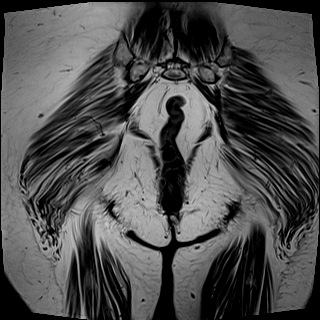
[im 9/25]
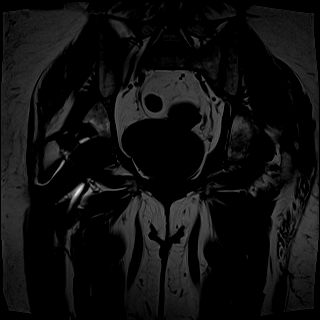
[im 17/25]
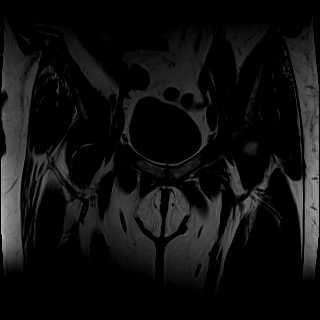
[im 25/25]
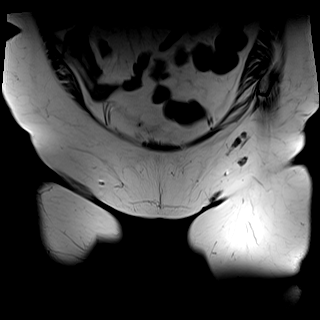

[Series 4: STIR · coronal · 5.0mm · 0.78mm/px · 4 of 25 slices shown]
[im 1/25]
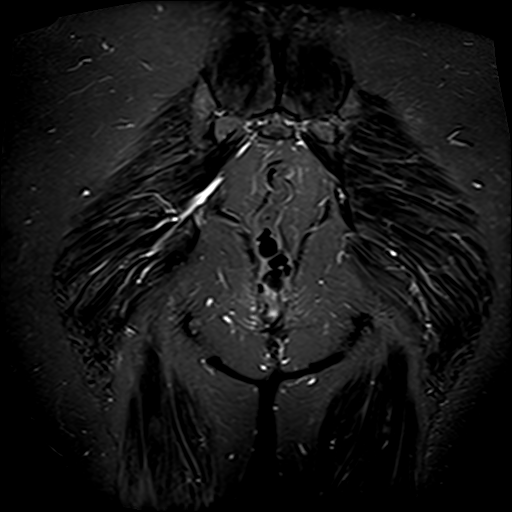
[im 9/25]
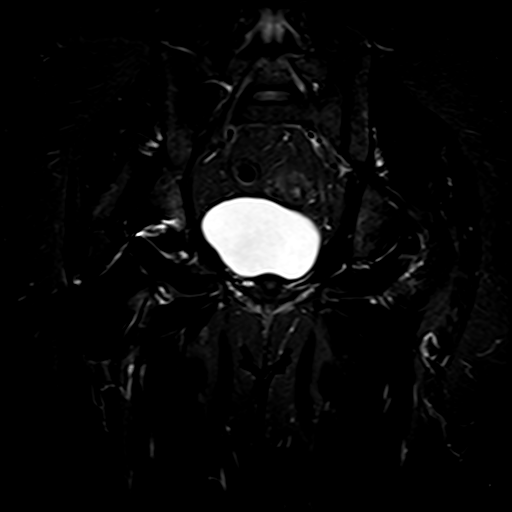
[im 17/25]
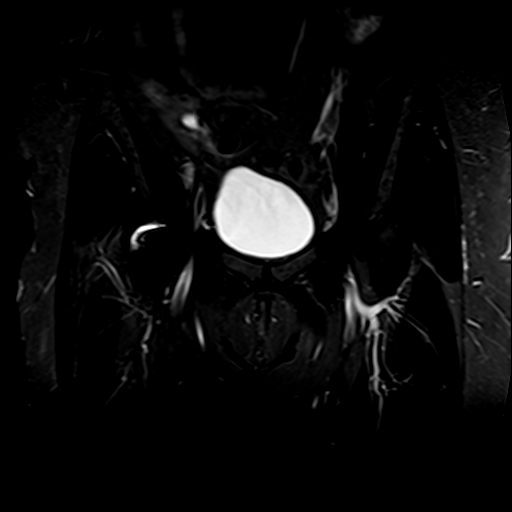
[im 25/25]
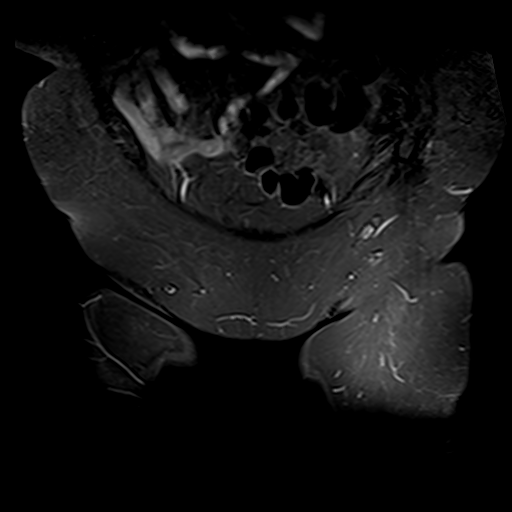

[Series 5: T2 · sagittal · 5.0mm · 0.71mm/px · 3 of 25 slices shown]
[im 1/25]
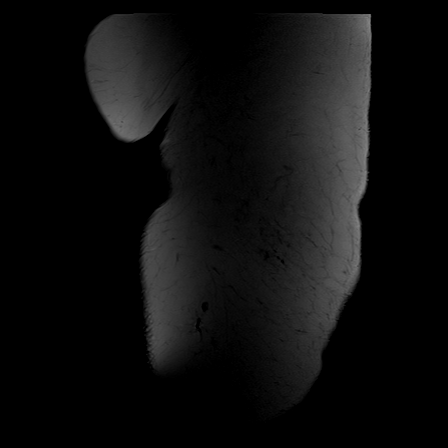
[im 9/25]
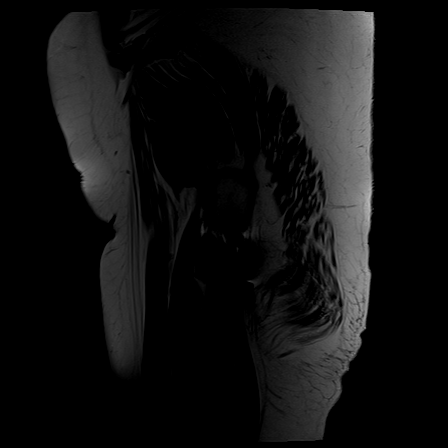
[im 17/25]
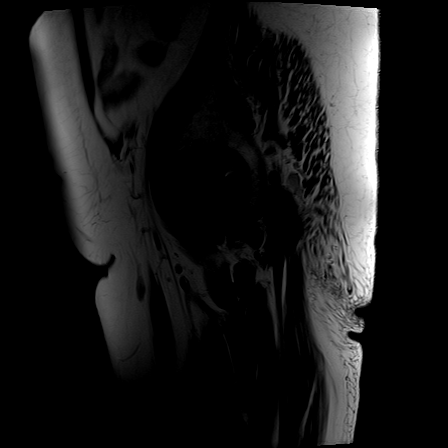

[Series 6: T2 fat-sat · axial · 5.0mm · 0.74mm/px · z∈[-100,+156]mm · 7 of 42 slices shown]
[im 1/42]
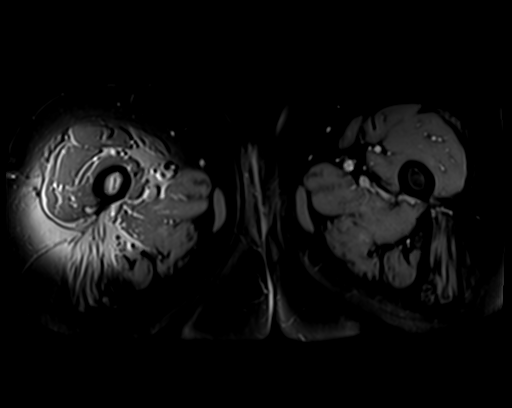
[im 7/42]
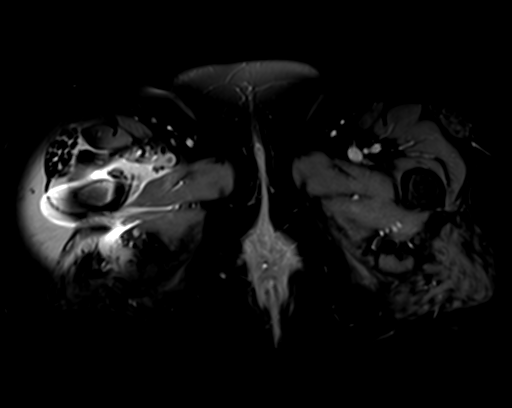
[im 14/42]
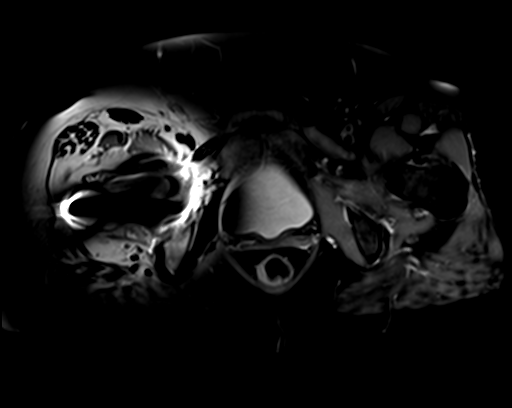
[im 21/42]
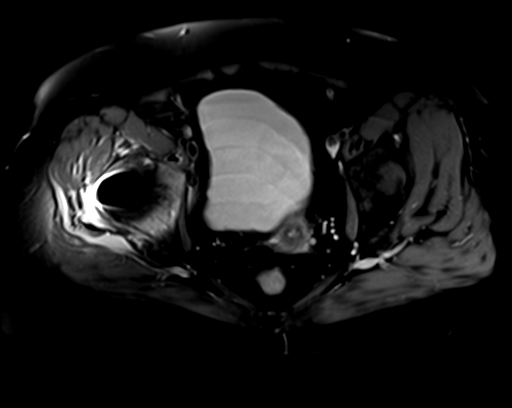
[im 28/42]
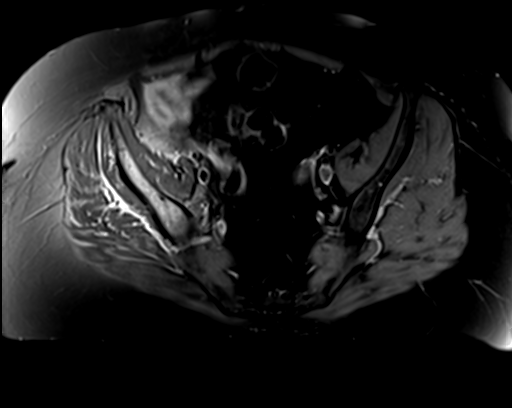
[im 35/42]
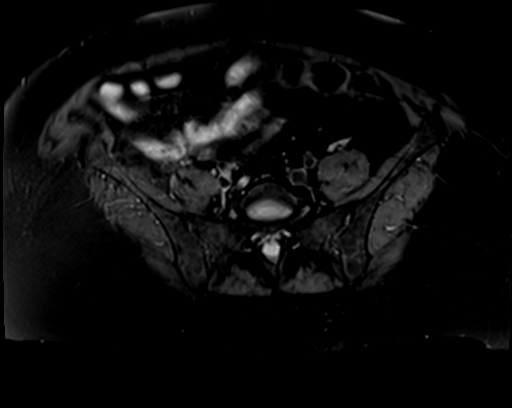
[im 42/42]
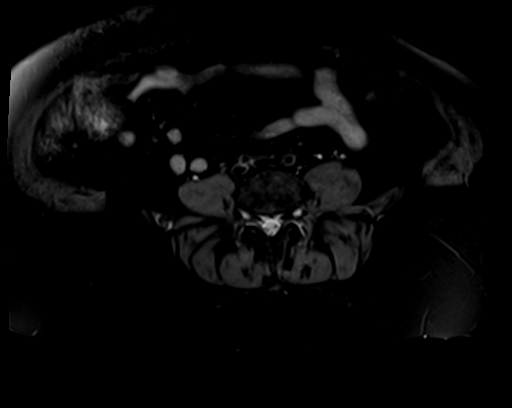

[25 of 40 positions shown; findings below may reference images not displayed]

FINDINGS: Despite using protocol for artifact reduction from hardware (BOUDRA
protocol), there is artifact about the right hip secondary to
fixation screws. No acute fracture is identified. Serpiginous T1 and
T2 hypointense line in the right femoral head is compatible with
avascular necrosis. Visualization is limited. No obvious femoral
head fragmentation or collapse is identified. Subchondral cysts are
identified in the right acetabular roof.

The left hip, sacroiliac joints and symphysis pubis all appear
normal. No evidence of bursitis is identified. All pelvic
musculature, including the hamstring origins, appears normal. Imaged
intrapelvic contents demonstrate a single urine fibroid measuring
1.1 cm in diameter.
IMPRESSION: Although visualization is limited, there appears to be avascular
necrosis of the right femoral head with secondary osteoarthritis
about the right hip. The examination is otherwise negative.

## 2016-03-09 IMAGING — CR RIGHT HIP - 1 VIEW
1 series · 1 of 1 positions shown · non-contrast
Comparison: February 13, 2013.

CLINICAL DATA: Status post right hip arthroplasty.

EXAM:
RIGHT HIP - 1 VIEW

[lat]
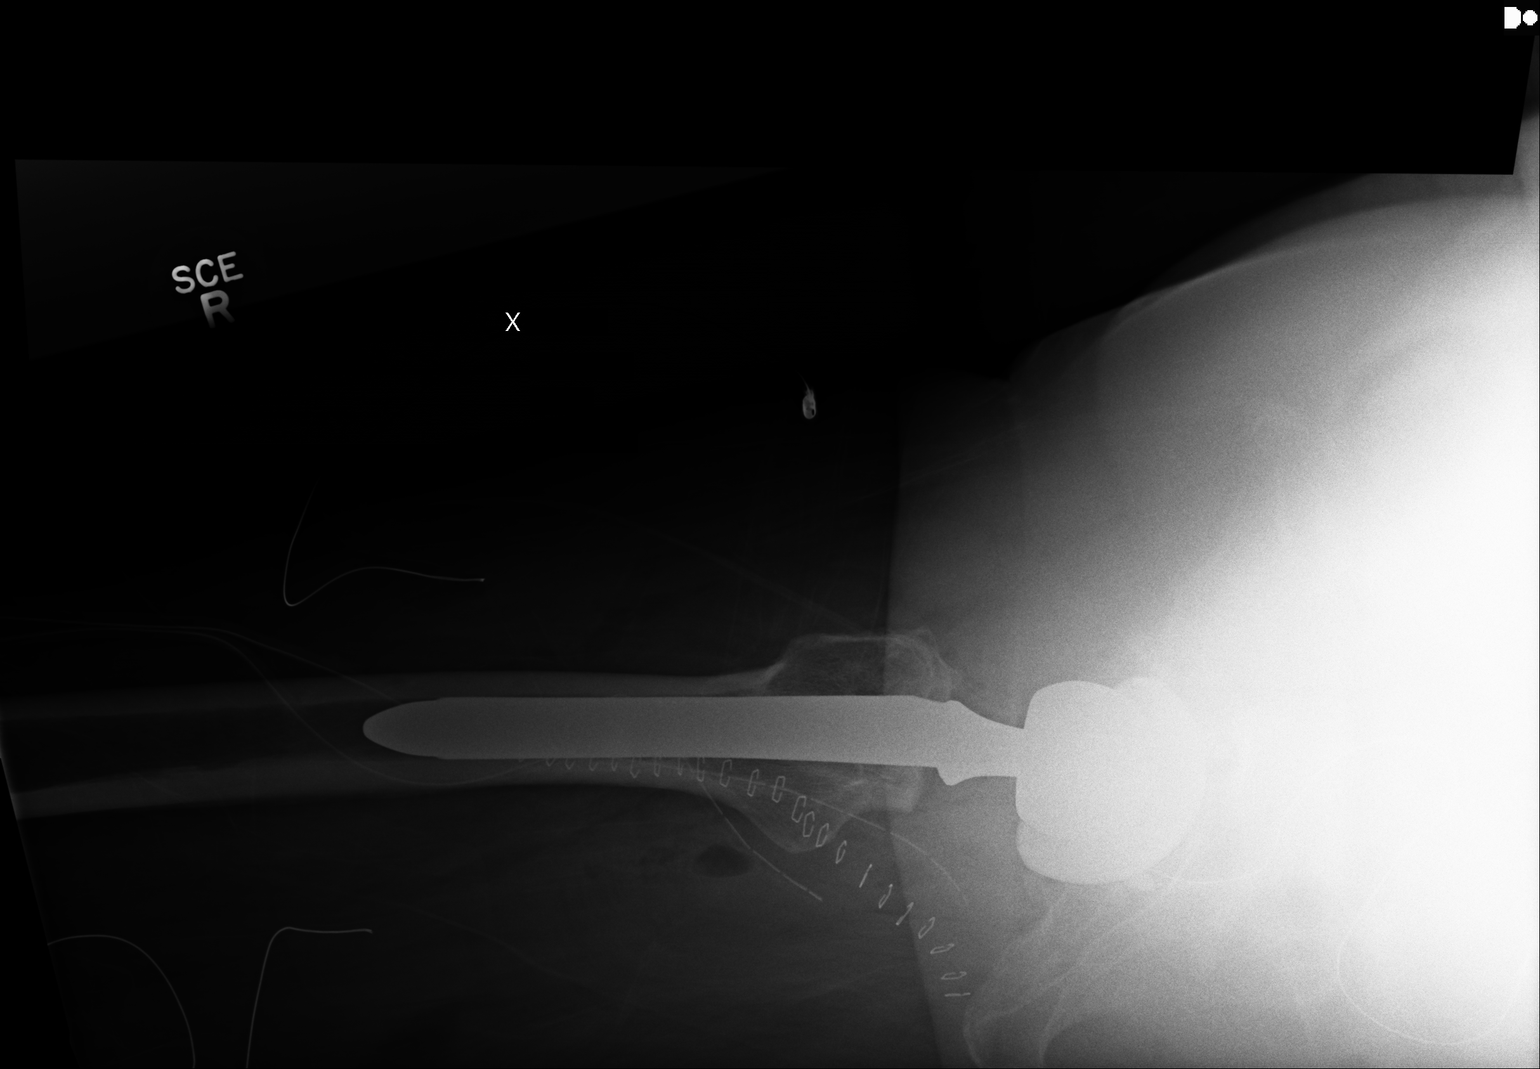

[1 of 1 positions shown; findings below may reference images not displayed]

FINDINGS: Status post right total hip arthroplasty. The femoral and acetabular
components appear well situated. No dislocation is noted. Surgical
drain is present.
IMPRESSION: Status post right total hip arthroplasty.

## 2016-11-09 ENCOUNTER — Other Ambulatory Visit: Payer: Self-pay | Admitting: Family Medicine

## 2016-11-09 DIAGNOSIS — Z1231 Encounter for screening mammogram for malignant neoplasm of breast: Secondary | ICD-10-CM

## 2016-12-15 ENCOUNTER — Ambulatory Visit
Admission: RE | Admit: 2016-12-15 | Discharge: 2016-12-15 | Disposition: A | Discharge status: Home / Self Care | Payer: Medicare Other | Source: Ambulatory Visit | Attending: Family Medicine | Admitting: Family Medicine

## 2016-12-15 DIAGNOSIS — Z1231 Encounter for screening mammogram for malignant neoplasm of breast: Secondary | ICD-10-CM | POA: Insufficient documentation

## 2017-03-02 ENCOUNTER — Encounter: Payer: Self-pay | Admitting: *Deleted

## 2017-03-03 ENCOUNTER — Ambulatory Visit: Payer: Medicare Other | Admitting: Anesthesiology

## 2017-03-03 ENCOUNTER — Ambulatory Visit
Admission: RE | Admit: 2017-03-03 | Discharge: 2017-03-03 | Disposition: A | Discharge status: Home / Self Care | Payer: Medicare Other | Source: Ambulatory Visit | Attending: Unknown Physician Specialty | Admitting: Unknown Physician Specialty

## 2017-03-03 ENCOUNTER — Encounter: Payer: Self-pay | Admitting: *Deleted

## 2017-03-03 ENCOUNTER — Encounter
Admission: RE | Disposition: A | Discharge status: Home / Self Care | Payer: Self-pay | Source: Ambulatory Visit | Attending: Unknown Physician Specialty

## 2017-03-03 DIAGNOSIS — D120 Benign neoplasm of cecum: Secondary | ICD-10-CM | POA: Insufficient documentation

## 2017-03-03 DIAGNOSIS — E119 Type 2 diabetes mellitus without complications: Secondary | ICD-10-CM | POA: Diagnosis not present

## 2017-03-03 DIAGNOSIS — Z87891 Personal history of nicotine dependence: Secondary | ICD-10-CM | POA: Diagnosis not present

## 2017-03-03 DIAGNOSIS — E785 Hyperlipidemia, unspecified: Secondary | ICD-10-CM | POA: Diagnosis not present

## 2017-03-03 DIAGNOSIS — Z7984 Long term (current) use of oral hypoglycemic drugs: Secondary | ICD-10-CM | POA: Insufficient documentation

## 2017-03-03 DIAGNOSIS — D122 Benign neoplasm of ascending colon: Secondary | ICD-10-CM | POA: Diagnosis not present

## 2017-03-03 DIAGNOSIS — K648 Other hemorrhoids: Secondary | ICD-10-CM | POA: Insufficient documentation

## 2017-03-03 DIAGNOSIS — Z79899 Other long term (current) drug therapy: Secondary | ICD-10-CM | POA: Insufficient documentation

## 2017-03-03 DIAGNOSIS — Z1211 Encounter for screening for malignant neoplasm of colon: Secondary | ICD-10-CM | POA: Insufficient documentation

## 2017-03-03 DIAGNOSIS — Z9851 Tubal ligation status: Secondary | ICD-10-CM | POA: Diagnosis not present

## 2017-03-03 DIAGNOSIS — Z800 Family history of malignant neoplasm of digestive organs: Secondary | ICD-10-CM | POA: Diagnosis not present

## 2017-03-03 HISTORY — DX: Hyperlipidemia, unspecified: E78.5

## 2017-03-03 HISTORY — DX: Type 2 diabetes mellitus without complications: E11.9

## 2017-03-03 HISTORY — PX: COLONOSCOPY WITH PROPOFOL: SHX5780

## 2017-03-03 HISTORY — DX: Benign neoplasm of unspecified breast: D24.9

## 2017-03-03 LAB — GLUCOSE, CAPILLARY: Glucose-Capillary: 142 mg/dL — ABNORMAL HIGH (ref 65–99)

## 2017-03-03 SURGERY — COLONOSCOPY WITH PROPOFOL
Anesthesia: General

## 2017-03-03 MED ORDER — SODIUM CHLORIDE 0.9 % IV SOLN: INTRAVENOUS | Status: DC

## 2017-03-03 MED ORDER — GLYCOPYRROLATE 0.2 MG/ML IJ SOLN
INTRAMUSCULAR | Status: AC
  Filled 2017-03-03: qty 1

## 2017-03-03 MED ORDER — PHENYLEPHRINE HCL 10 MG/ML IJ SOLN
INTRAMUSCULAR | Status: DC | PRN
  Administered 2017-03-03 (×2): 100 ug via INTRAVENOUS

## 2017-03-03 MED ORDER — PROPOFOL 10 MG/ML IV BOLUS
INTRAVENOUS | Status: DC | PRN
  Administered 2017-03-03: 100 mg via INTRAVENOUS

## 2017-03-03 MED ORDER — FENTANYL CITRATE (PF) 100 MCG/2ML IJ SOLN
INTRAMUSCULAR | Status: DC | PRN
  Administered 2017-03-03: 50 ug via INTRAVENOUS

## 2017-03-03 MED ORDER — SODIUM CHLORIDE 0.9 % IV SOLN
INTRAVENOUS | Status: DC
  Administered 2017-03-03 (×2): via INTRAVENOUS

## 2017-03-03 MED ORDER — MIDAZOLAM HCL 2 MG/2ML IJ SOLN
INTRAMUSCULAR | Status: AC
  Filled 2017-03-03: qty 2

## 2017-03-03 MED ORDER — PROPOFOL 500 MG/50ML IV EMUL
INTRAVENOUS | Status: DC | PRN
  Administered 2017-03-03: 150 ug/kg/min via INTRAVENOUS

## 2017-03-03 MED ORDER — MIDAZOLAM HCL 5 MG/5ML IJ SOLN
INTRAMUSCULAR | Status: DC | PRN
  Administered 2017-03-03: 1 mg via INTRAVENOUS

## 2017-03-03 MED ORDER — LIDOCAINE 2% (20 MG/ML) 5 ML SYRINGE
INTRAMUSCULAR | Status: DC | PRN
  Administered 2017-03-03: 40 mg via INTRAVENOUS

## 2017-03-03 MED ORDER — PROPOFOL 500 MG/50ML IV EMUL
INTRAVENOUS | Status: AC
  Filled 2017-03-03: qty 50

## 2017-03-03 MED ORDER — FENTANYL CITRATE (PF) 100 MCG/2ML IJ SOLN
INTRAMUSCULAR | Status: AC
  Filled 2017-03-03: qty 2

## 2017-03-03 NOTE — Op Note (Signed)
[facility] Gastroenterology Patient Name: Kathryn Yates Procedure Date: 03/03/2017 8:50 AM MRN: [id number] Account #: [id number] Date of Birth: November 20, 1950 Admit Type: Outpatient Age: 66 Room: [facility] ENDO ROOM 4 Gender: Female Note Status: Finalized Procedure:            Colonoscopy Indications:          Screening in patient at increased risk: Family history                        of 1st-degree relative with colorectal cancer Providers:            Manya Silvas, MD Referring MD:         Caprice Renshaw MD (Referring MD) Medicines:            Propofol per Anesthesia Complications:        No immediate complications. Procedure:            Pre-Anesthesia Assessment:                       - After reviewing the risks and benefits, the patient                        was deemed in satisfactory condition to undergo the                        procedure.                       After obtaining informed consent, the colonoscope was                        passed under direct vision. Throughout the procedure,                        the patient's blood pressure, pulse, and oxygen                        saturations were monitored continuously. The                        Colonoscope was introduced through the anus and                        advanced to the the cecum, identified by appendiceal                        orifice and ileocecal valve. The colonoscopy was                        performed without difficulty. The patient tolerated the                        procedure well. The quality of the bowel preparation                        was excellent. Findings:      Two sessile polyps were found in the cecum. The polyps were diminutive       in size. These polyps were removed with a jumbo cold forceps. Resection       and retrieval were complete.  A 20 mm polyp was found in the proximal ascending colon. The polyp was       sessile. The polyp was removed with a hot  snare. Resection and retrieval       were complete. To prevent bleeding after the polypectomy, three       hemostatic clips were successfully placed. There was no bleeding during,       or at the end, of the procedure.      Internal hemorrhoids were found during endoscopy. The hemorrhoids were       medium-sized and Grade I (internal hemorrhoids that do not prolapse).      The exam was otherwise without abnormality. Impression:           - Two diminutive polyps in the cecum, removed with a                        jumbo cold forceps. Resected and retrieved.                       - One 20 mm polyp in the proximal ascending colon,                        removed with a hot snare. Resected and retrieved. Clips                        were placed.                       - Internal hemorrhoids.                       - The examination was otherwise normal. Recommendation:       - Await pathology results. Due to size and location and                        family history will repeat in one year. Manya Silvas, MD 03/03/2017 9:30:18 AM This report has been signed electronically. Number of Addenda: 0 Note Initiated On: 03/03/2017 8:50 AM Scope Withdrawal Time: 0 hours 23 minutes 20 seconds  Total Procedure Duration: 0 hours 28 minutes 59 seconds       [facility]

## 2017-03-03 NOTE — Anesthesia Preprocedure Evaluation (Signed)
Anesthesia Evaluation  Patient identified by MRN, date of birth, ID band Patient awake    Reviewed: Allergy & Precautions, NPO status , Patient's Chart, lab work & pertinent test results  History of Anesthesia Complications Negative for: history of anesthetic complications  Airway Mallampati: II       Dental   Pulmonary neg pulmonary ROS, former smoker,           Cardiovascular negative cardio ROS       Neuro/Psych negative neurological ROS     GI/Hepatic negative GI ROS, Neg liver ROS,   Endo/Other  diabetes, Type 2, Oral Hypoglycemic Agents  Renal/GU negative Renal ROS     Musculoskeletal   Abdominal   Peds  Hematology   Anesthesia Other Findings   Reproductive/Obstetrics                             Anesthesia Physical Anesthesia Plan  ASA: II  Anesthesia Plan: General   Post-op Pain Management:    Induction: Intravenous  Airway Management Planned: Nasal Cannula  Additional Equipment:   Intra-op Plan:   Post-operative Plan:   Informed Consent: I have reviewed the patients History and Physical, chart, labs and discussed the procedure including the risks, benefits and alternatives for the proposed anesthesia with the patient or authorized representative who has indicated his/her understanding and acceptance.     Plan Discussed with:   Anesthesia Plan Comments:         Anesthesia Quick Evaluation

## 2017-03-03 NOTE — Transfer of Care (Signed)
Immediate Anesthesia Transfer of Care Note  Patient: Kathryn Yates  Procedure(s) Performed: Procedure(s): COLONOSCOPY WITH PROPOFOL (N/A)  Patient Location: PACU and Endoscopy Unit  Anesthesia Type:General  Level of Consciousness: sedated  Airway & Oxygen Therapy: Patient Spontanous Breathing and Patient connected to nasal cannula oxygen  Post-op Assessment: Report given to RN and Post -op Vital signs reviewed and stable  Post vital signs: Reviewed and stable  Last Vitals:  Vitals:   03/03/17 0727  Pulse: (P) 80  Resp: (P) 18  Temp: (P) 37.4 C    Last Pain:  Vitals:   03/03/17 0727  TempSrc: (P) Tympanic         Complications: No apparent anesthesia complications

## 2017-03-03 NOTE — H&P (Signed)
   Primary Care Physician:  Derinda Late, MD Primary Gastroenterologist:  Dr. Vira Agar  Pre-Procedure History & Physical: HPI:  Kathryn Yates is a 66 y.o. female is here for an colonoscopy.   Past Medical History:  Diagnosis Date  . Diabetes mellitus without complication ([place])   . Fibroadenoma of breast   . Hyperlipidemia     Past Surgical History:  Procedure Laterality Date  . BREAST EXCISIONAL BIOPSY Left 2007   neg  . BREAST EXCISIONAL BIOPSY Right 1989   neg  . EYE SURGERY     cataract extraction  . HIP ARTHROSCOPY    . TUBAL LIGATION      Prior to Admission medications   Medication Sig Start Date End Date Taking? Authorizing Provider  cetirizine (ZYRTEC) 10 MG tablet Take 10 mg by mouth daily.   Yes [provider]  gabapentin (NEURONTIN) 400 MG capsule Take 400 mg by mouth 3 (three) times daily.   Yes [provider]  meloxicam (MOBIC) 15 MG tablet Take 15 mg by mouth daily.   Yes [provider]  metFORMIN (GLUCOPHAGE) 500 MG tablet Take by mouth 2 (two) times daily with a meal.   Yes [provider]  simvastatin (ZOCOR) 20 MG tablet Take 20 mg by mouth daily.   Yes [provider]  sitaGLIPtin (JANUVIA) 100 MG tablet Take 100 mg by mouth daily.   Yes [provider]  CALCIUM CITRATE-VITAMIN D3 PO Take by mouth.    [provider]    Allergies as of 12/28/2016  . (Not on File)    Family History  Problem Relation Age of Onset  . Breast cancer Mother 28  . Breast cancer Maternal Aunt 60    Social History   Social History  . Marital status: Married    Spouse name: N/A  . Number of children: N/A  . Years of education: N/A   Occupational History  . Not on file.   Social History Main Topics  . Smoking status: Former Smoker    Quit date: 12/12/1978  . Smokeless tobacco: Never Used  . Alcohol use Yes  . Drug use: No  . Sexual activity: Not on file   Other Topics Concern  . Not on file    Social History Narrative  . No narrative on file    Review of Systems: See HPI, otherwise negative ROS  Physical Exam: Pulse (P) 80   Temp (P) 99.3 F (37.4 C) (Tympanic)   Resp (P) 18   Ht (P) 5\' 7"  (1.702 m)   Wt (P) 88.5 kg (195 lb)   BMI (P) 30.54 kg/m  General:   Alert,  pleasant and cooperative in NAD Head:  Normocephalic and atraumatic. Neck:  Supple; no masses or thyromegaly. Lungs:  Clear throughout to auscultation.    Heart:  Regular rate and rhythm. Abdomen:  Soft, nontender and nondistended. Normal bowel sounds, without guarding, and without rebound.   Neurologic:  Alert and  oriented x4;  grossly normal neurologically.  Impression/Plan: Kathryn Yates is here for an colonoscopy to be performed for FH colon cancer in mother  Risks, benefits, limitations, and alternatives regarding  colonoscopy have been reviewed with the patient.  Questions have been answered.  All parties agreeable.   Gaylyn Cheers, MD  03/03/2017, 8:43 AM

## 2017-03-03 NOTE — Anesthesia Postprocedure Evaluation (Signed)
Anesthesia Post Note  Patient: Kathryn Yates  Procedure(s) Performed: Procedure(s) (LRB): COLONOSCOPY WITH PROPOFOL (N/A)  Patient location during evaluation: Endoscopy Anesthesia Type: General Level of consciousness: awake and alert Pain management: pain level controlled Vital Signs Assessment: post-procedure vital signs reviewed and stable Respiratory status: spontaneous breathing and respiratory function stable Cardiovascular status: stable Anesthetic complications: no     Last Vitals:  Vitals:   03/03/17 0931 03/03/17 0941  BP: (!) 95/45 108/61  Pulse: 66 67  Resp: 12 14  Temp:      Last Pain:  Vitals:   03/03/17 0931  TempSrc: Axillary                 Waver Dibiasio K

## 2017-03-03 NOTE — Anesthesia Post-op Follow-up Note (Cosign Needed)
Anesthesia QCDR form completed.        

## 2017-03-04 ENCOUNTER — Encounter: Payer: Self-pay | Admitting: Unknown Physician Specialty

## 2017-03-04 LAB — SURGICAL PATHOLOGY

## 2017-11-19 ENCOUNTER — Other Ambulatory Visit: Payer: Self-pay | Admitting: Family Medicine

## 2017-11-19 DIAGNOSIS — Z1231 Encounter for screening mammogram for malignant neoplasm of breast: Secondary | ICD-10-CM

## 2019-07-21 NOTE — Nursing Note (Signed)
Adult Admission Assessment - Text       Perioperative Admission Assessment Entered On:  07/21/2019 11:49 EDT    Performed On:  07/21/2019 11:48 EDT by Lamar Sprinkles               General   Call Complete :   07/24/2019 12:53 EDT   Information Given By :   Self   Height/Length Estimated :   170.0 cm(Converted to: 5 ft 7 in, 5.58 ft, 66.93 in)    BMI   Estimated :   81.8 kg(Converted to: 180 lb 5 oz, 180.338 lb)    Body Mass Index Estimated :   28.3 kg/m2   Primary Care Physician/Specialists :   DR. [facility]   Emergency Contact Name :   Fritz Pickerel Baka/HUSBAND   Emergency Contact Phone :   [phone]   Languages :   Melina Fiddler, RN, Norton Pastel - 07/24/2019 12:04 EDT   Call Start :   07/24/2019 12:01 EDT   Pogorzelski, RN, Norton Pastel - 07/24/2019 12:00 EDT   Allergies   (As Of: 07/28/2019 11:28:55 EDT)   Allergies (Active)   No Known Allergies  Estimated Onset Date:   Unspecified ; Created ByRonalee Belts, RN, Norton Pastel; Reaction Status:   Active ; Category:   Drug ; Substance:   No Known Allergies ; Type:   Allergy ; Updated By:   Ronalee Belts, RN, Norton Pastel; Reviewed Date:   07/28/2019 11:25 EDT        Medication History   Medication List   (As Of: 07/28/2019 11:28:55 EDT)   Normal Order    Lactated Ringers Injection solution 1,000 mL  :   Lactated Ringers Injection solution 1,000 mL ; Status:   Ordered ; Ordered As Mnemonic:   Lactated Ringers Injection 1,000 mL ; Simple Display Line:   10 mL/hr, IV ; Ordering Provider:   Annamarie Major; Catalog Code:   Lactated Ringers Injection ; Order Dt/Tm:   07/28/2019 11:18:22 EDT ; Comment:   Perioperative use ONLY  For Non Dialysis Patient          Sodium Chloride 0.9% intravenous solution 500 mL  :   Sodium Chloride 0.9% intravenous solution 500 mL ; Status:   Ordered ; Ordered As Mnemonic:   Sodium Chloride 0.9% 500 mL ; Simple Display Line:   10 mL/hr, IV ; Ordering Provider:   Annamarie Major; Catalog  Code:   Sodium Chloride 0.9% ; Order Dt/Tm:   07/28/2019 11:18:22 EDT ; Comment:   Perioperative use ONLY  For Dialysis Patient          acetaminophen 500 mg Tab  :   acetaminophen 500 mg Tab ; Status:   Ordered ; Ordered As Mnemonic:   Tylenol ; Simple Display Line:   1,000 mg, 2 tabs, Oral, On Call ; Ordering Provider:   Annamarie Major; Catalog Code:   acetaminophen ; Order Dt/Tm:   07/28/2019 11:18:22 EDT          gabapentin 100 mg Cap  :   gabapentin 100 mg Cap ; Status:   Ordered ; Ordered As Mnemonic:   Neurontin ; Simple Display Line:   100 mg, 1 caps, Oral, On Call ; Ordering Provider:   Annamarie Major; Catalog Code:   gabapentin ; Order Dt/Tm:   07/28/2019 11:18:22 EDT ; Comment:   For patients > 44 years old  ketorolac 30 mg/mL Inj Soln 1 mL  :   ketorolac 30 mg/mL Inj Soln 1 mL ; Status:   Ordered ; Ordered As Mnemonic:   Toradol ; Simple Display Line:   30 mg, 1 mL, IV Push, On Call ; Ordering Provider:   Stormy Card; Catalog Code:   ketorolac ; Order Dt/Tm:   07/28/2019 11:18:22 EDT ; Comment:   May subsitute for Celebrex if patient is NPO            Home Meds    cetirizine  :   cetirizine ; Status:   Documented ; Ordered As Mnemonic:   ZyrTEC 10 mg oral tablet ; Simple Display Line:   10 mg, 1 tabs, Oral, Daily, 30 tabs, 0 Refill(s) ; Catalog Code:   cetirizine ; Order Dt/Tm:   07/24/2019 12:25:20 EDT          metFORMIN  :   metFORMIN ; Status:   Documented ; Ordered As Mnemonic:   metFORMIN 500 mg oral tablet ; Simple Display Line:   500 mg, 1 tabs, Oral, qAM, 60 tabs, 0 Refill(s) ; Catalog Code:   metFORMIN ; Order Dt/Tm:   07/24/2019 12:24:27 EDT          metFORMIN  :   metFORMIN ; Status:   Documented ; Ordered As Mnemonic:   metFORMIN 500 mg oral tablet ; Simple Display Line:   1,000 mg, 2 tabs, Oral, qPM, 60 tabs, 0 Refill(s) ; Catalog Code:   metFORMIN ; Order Dt/Tm:   07/24/2019 12:24:40 EDT          multivitamin with minerals  :   multivitamin with minerals  ; Status:   Documented ; Ordered As Mnemonic:   Multi-Day Plus Minerals oral tablet ; Simple Display Line:   1 tabs, Oral, Daily, 30 tabs, 0 Refill(s) ; Catalog Code:   multivitamin with minerals ; Order Dt/Tm:   07/24/2019 12:24:59 EDT          dulaglutide  :   dulaglutide ; Status:   Documented ; Ordered As Mnemonic:   Trulicity Pen 1.5 mg/0.5 mL subcutaneous solution ; Simple Display Line:   1.5 mg, Subcutaneous, Wednesday, 0 Refill(s) ; Catalog Code:   dulaglutide ; Order Dt/Tm:   07/24/2019 12:07:58 EDT          simvastatin  :   simvastatin ; Status:   Documented ; Ordered As Mnemonic:   simvastatin 20 mg oral tablet ; Simple Display Line:   20 mg, 1 tabs, Oral, Once a Day (at bedtime), 0 Refill(s) ; Catalog Code:   simvastatin ; Order Dt/Tm:   07/24/2019 12:07:58 EDT            Problem History   (As Of: 07/28/2019 11:28:56 EDT)   Problems(Active)    Diabetes (SNOMED CT  :701779390 )  Name of Problem:   Diabetes ; Recorder:   Pogorzelski, RN, Orson Ape; Confirmation:   Confirmed ; Classification:   Patient Stated ; Code:   300923300 ; Contributor System:   PowerChart ; Last Updated:   07/24/2019 12:26 EDT ; Life Cycle Status:   Active ; Vocabulary:   SNOMED CT   ; Comments:        07/24/2019 12:26 - Pogorzelski, RN, Orson Ape  STATES GLUCOSE RUNS 135-140      Hemorrhoids (SNOMED CT  :762263335 )  Name of Problem:   Hemorrhoids ; Recorder:   Pogorzelski, Charity fundraiser, Orson Ape; Confirmation:   Confirmed ; Classification:  Patient Stated ; Code:   161096045 ; Contributor System:   PowerChart ; Last Updated:   07/24/2019 12:08 EDT ; Life Cycle Date:   07/24/2019 ; Life Cycle Status:   Active ; Vocabulary:   SNOMED CT        Hyperlipidemia (SNOMED CT  :40981191 )  Name of Problem:   Hyperlipidemia ; Recorder:   Pogorzelski, RN, Orson Ape; Confirmation:   Confirmed ; Classification:   Patient Stated ; Code:   47829562 ; Contributor System:   PowerChart ; Last Updated:   07/24/2019 12:08 EDT ; Life Cycle Date:   07/24/2019 ;  Life Cycle Status:   Active ; Vocabulary:   SNOMED CT        Seasonal allergies (SNOMED CT  :1308657846 )  Name of Problem:   Seasonal allergies ; Recorder:   Pogorzelski, RN, Orson Ape; Confirmation:   Confirmed ; Classification:   Patient Stated ; Code:   [id number] ; Contributor System:   Dietitian ; Last Updated:   07/24/2019 12:08 EDT ; Life Cycle Date:   07/24/2019 ; Life Cycle Status:   Active ; Vocabulary:   SNOMED CT          Diagnoses(Active)    Complex third degree hemorrhoid  Date:   07/28/2019 ; Confirmation:   Confirmed ; Clinical Dx:   Complex third degree hemorrhoid ; Classification:   Medical ; Clinical Service:   Non-Specified ; Code:   ICD-10-CM ; Probability:   0 ; Diagnosis Code:   K64.2        Procedure History        -    Procedure History   (As Of: 07/28/2019 11:28:56 EDT)     Procedure Dt/Tm:   2440 ; Anesthesia Minutes:   0 ; Procedure Name:   Arthroplasty of right hip joint ; Procedure Minutes:   0 ; Last Reviewed Dt/Tm:   07/28/2019 11:28:06 EDT            Procedure Dt/Tm:   06/09/2019 ; Anesthesia Minutes:   0 ; Procedure Name:   Colonoscopy ; Procedure Minutes:   0 ; Last Reviewed Dt/Tm:   07/28/2019 11:28:06 EDT            History Confirmation   Problem History Changes PAT :   No   Procedure History Changes PAT :   No   Cyril Mourning, RN, Leitha Bleak - 07/28/2019 11:25 EDT   Anesthesia/Sedation   Anesthesia History :   Prior general anesthesia   SN - Malignant Hyperthermia :   Denies   Previous Problem with Anesthesia :   None   Moderate Sedation History :   Prior sedation for procedure   Previous Problem With Sedation :   None   Symptoms of Sleep Apnea :   Age greater than 50   Symptoms of Sleep Apnea Score :   1    Shortness of Breath Indicator :   No shortness of breath   Pogorzelski, RN, Orson Ape - 07/24/2019 12:04 EDT   Bloodless Medicine   Is Blood Transfusion Acceptable to Patient :   Yes   Pogorzelski, RN, Orson Ape - 07/24/2019 12:04 EDT   ID Risk Screen Symptoms   MRSA/VRE Screening :    None of these apply   Tawana Scale - 07/28/2019 11:25 EDT   Recent Travel History :   No recent travel   Close Contact with COVID-19 ID :   Preadmission testing patients only  Pogorzelski, RN, Orson ApeKatherine W - 07/24/2019 12:04 EDT   Last 14 days COVID-19 ID :   Yes - Results unknown/pending   Pogorzelski, RN, Orson ApeKatherine W - 07/25/2019 10:34 EDT     TB Symptom Screen :   No symptoms   C. diff Symptom/History ID :   Neither of the above   Pogorzelski, RN, Orson ApeKatherine W - 07/24/2019 12:04 EDT   ID COVID-19 Screen   Fever OR Chills :   No   Headache :   No   New or Worsening Cough :   No   Fatigue :   No   Shortness of Breath ID :   No   Myalgia (Muscle Pain) :   No   Dyspnea :   No   Diarrhea :   No   Sore Throat :   No   Nausea :   No   Laryngitis :   No   Sudden Loss of Taste or Smell :   No   Pogorzelski, RN, Orson ApeKatherine W - 07/24/2019 12:04 EDT   Social History   Social History   (As Of: 07/28/2019 11:28:56 EDT)   Tobacco:        Tobacco use: Former smoker, quit more than 30 days ago.   (Last Updated: 07/24/2019 12:11:07 EDT by Melida QuitterPogorzelski, RN, Orson ApeKatherine W)          Electronic Cigarette/Vaping:        Never Electronic Cigarette Use.   (Last Updated: 07/24/2019 12:11:42 EDT by Melida QuitterPogorzelski, RN, Orson ApeKatherine W)          Alcohol:        Denies   (Last Updated: 07/24/2019 12:11:20 EDT by Melida QuitterPogorzelski, RN, Orson ApeKatherine W)          Substance Use:        Opioid Naive, Denies   (Last Updated: 07/24/2019 12:11:29 EDT by Melida QuitterPogorzelski, RN, Orson ApeKatherine W)            Advance Directive   Type of Advance Directive :   Living will, Medical durable power of attorney   Location of Advance Directive :   Unable to obtain copy   Tawana ScaleLarson, RN, Cheryl L - 07/28/2019 11:25 EDT   Advance Directive :   Yes   Pogorzelski, RN, Orson ApeKatherine W - 07/24/2019 12:04 EDT   PAT Patient Instructions   Patient Arrival Time PAT :   07/28/2019 11:30 EDT   Medications in AM :   ZYRTEC   Medication Understanding :   Verbalizes understanding   NPO PAT :   NPO after midnight    Pogorzelski, RN, Orson ApeKatherine W - 07/24/2019 12:04 EDT   PAT Instructions Grid   Make up Understanding :   Trenton GammonVerbalizes understanding   Jewelry Understanding :   Verbalizes understanding   Perfume Understanding :   Verbalizes understanding   Valuables Understanding :   Verbalizes understanding   Smoking Understanding :   Verbalizes understanding   Clothing Understanding :   Financial traderVerbalizes understanding   Contact MD for Illness :   Financial traderVerbalizes understanding   Contact MD for skin injury :   Verbalizes understanding   Pogorzelski, Charity fundraiserN, Orson ApeKatherine W - 07/24/2019 12:04 EDT   Service Line PAT :   Gen Surg   Laterality PAT :   N/A   Prep PAT :   Hibiclens, Fleets Enema, Other: FOLLOW INSTRUCTIONS FOR FLEET PREP AS DIRECTED BY OFFICE   Name of Contact PAT :   LARRY Ergle   Relationship of Contact PAT :  HUSBAND   Contact Number PAT :   [phone]   Transportation Instructions PAT :   Accompany to Hospital, Transport Home, Remain with 24 hours post-procedure   Pogorzelski, RN, Orson Ape - 07/24/2019 12:04 EDT   Additional Contacts PAT :   10/5 [facility] covid testing/patient is aware -ckp   Adela Glimpse K - 07/21/2019 11:48 EDT   Harm Screen   Feels Unsafe at Home :   No   Last 3 mo, thoughts killing self/others :   Patient denies   Tawana Scale - 07/28/2019 11:25 EDT

## 2019-07-28 LAB — POCT GLUCOSE: POC Glucose: 116 mg/dL (ref 70.0–120.0)

## 2019-07-28 NOTE — Op Note (Signed)
Phase I Record - [facility]             Phase I Record - [facility] Summary                                                                   Primary Physician:        [name]    Case Number:              [id number]    Finalized Date/Time:      07/28/19 14:03:39    Pt. Name:                 Allison Warner, Allison Warner    D.O.B./Sex:               [birth year]    Female    Med Rec #:                [id number]    Physician:                [name]    Financial #:              [id number]    Pt. Type:                 S    Room/Bed:                 /    Admit/Disch:              07/28/19 08:21:00 -    Institution:       [facility] Case Attendance - Phase I                                                                                            Entry 1                                                                                                          Case Attendee             Varricchio, RN, Vincent         Role Performed                  Post Anesthesia Care                              P  Nurse    Last Modified By:         Horton Finer RN, Aleatha Borer 07/28/19 14:02:59      [facility] - Case Times - Phase I                                                                                               Entry 1                                                                                                          Phase I In                07/28/19 13:28:00               Phase I Out                     07/28/19 13:48:00    Phase I Discharge         07/28/19 13:48:00    Time     Last Modified By:         Horton Finer RN, Aleatha Borer 07/28/19 14:03:26              Finalized By: Horton Finer, RN, Aleatha Borer      Document Signatures                                                                             Signed By:           Horton Finer RN, Aleatha Borer 07/28/19 14:03

## 2019-07-28 NOTE — Anesthesia Pre-Procedure Evaluation (Signed)
Preanesthesia Evaluation        Patient:   Allison Warner, Allison Warner             MRN: [id number]            FIN: [id number]               Age:   68 years     Sex:  Female     DOB:  1951/08/11   Associated Diagnoses:   None   Author:   Felicity Coyer  [place] Status   Allergies:    Allergic Reactions (Selected)  No Known Allergies,    Allergies    (Active and Proposed Allergies Only)  No Known Allergies   (Severity: Unknown severity, Onset: Unknown)     Current medications:    Home Medications (6) Active  metFORMIN 500 mg oral tablet 500 mg = 1 tabs, Oral, qAM  metFORMIN 500 mg oral tablet 1,000 mg = 2 tabs, Oral, qPM  Multi-Day Plus Minerals oral tablet 1 tabs, Oral, Daily  simvastatin 20 mg oral tablet 20 mg = 1 tabs, Oral, Once a Day (at bedtime)  Trulicity Pen 1.5 YI/9.4 mL subcutaneous solution 1.5 mg, Subcutaneous, Wednesday  ZyrTEC 10 mg oral tablet 10 mg = 1 tabs, Oral, Daily  ,    Medications (1) Active  Scheduled: (0)  Continuous: (1)  Lactated Ringers Injection solution 1,000 mL  1,000 mL, IV, 10 mL/hr  PRN: (0)     Problem list:    Active Problems (4)  Diabetes   Hemorrhoids   Hyperlipidemia   Seasonal allergies   ,    Problems   (Active Problems Only)    Seasonal allergy   (SNOMED CT: 8546270350, Onset: --)  Hyperlipidemia   (SNOMED CT: 09381829, Onset: --)  Diabetes mellitus   (SNOMED CT: 937169678, Onset: --)   Comment:  STATES GLUCOSE RUNS 135-140  (Pogorzelski, RN, Norton Pastel on 07/24/19)                        Hemorrhoids   (SNOMED CT: 938101751, Onset: --)        Histories   Past Medical History:    No active or resolved past medical history items have been selected or recorded.   Procedure history:    Colonoscopy (025852778) on 06/09/2019 at 66 Years.  Arthroplasty of right hip joint ([id number]) in 2015 at 1 Years.   Social History        Social & Psychosocial Habits    Alcohol  07/24/2019  Use: Denies    Substance Use  07/24/2019  Opioid Assessment Opioid Naive    Use:  Denies    Tobacco  07/24/2019  Use: Former smoker, quit more    Electronic Cigarette/Vaping  07/24/2019  Electronic Cigarette Use: Never  .        Physical Examination   Vital Signs   05/24/7618 50:93 EDT Systolic Blood Pressure 267 mmHg    Diastolic Blood Pressure 77 mmHg    Temperature Oral 36.5 degC    Heart Rate Monitored 83 bpm    Respiratory Rate 20 br/min    SpO2 98 %         Vital Signs (last 24 hrs)_____  Last Charted___________  Temp Oral     36.5 degC  (OCT 09 11:23)  Resp Rate         20 br/min  (OCT 09 11:23)  SBP  130 mmHg  (OCT 09 11:23)  DBP      77 mmHg  (OCT 09 11:23)  SpO2      98 %  (OCT 09 11:23)  Weight      82.2 kg  (OCT 09 11:23)  Height      170 cm  (OCT 09 11:23)  BMI      28.44  (OCT 09 11:35)     Measurements from flowsheet : Measurements   07/28/2019 11:35 EDT Body Mass Index est meas 28.44 kg/m2    Body Mass Index Measured 28.44 kg/m2   07/28/2019 11:23 EDT Height/Length Measured 170 cm    Weight Measured 82.2 kg    Weight Dosing 82.2 kg      Pain assessment:  Pain Assessment   07/28/2019 11:23 EDT      Numeric Rating Pain Scale 0 = No pain     .       Review / Management   Results review:     No qualifying data available, Lab results: 07/28/2019 11:53 EDT      Glucose POC               116.0 mg/dL  .       Assessment and Plan   American Society of Anesthesiologists#(ASA) physical status classification:  Class II.    Anesthetic Preoperative Plan     Anesthetic technique: General anesthesia.     Maintenance airway: Oral endotracheal tube.     Signature Line     Electronically Signed on 07/28/2019 12:23 PM EDT   ________________________________________________   Newt Lukes

## 2019-07-28 NOTE — Nursing Note (Signed)
Nursing Discharge Summary - Text       Nursing Discharge Summary Entered On:  07/28/2019 13:37 EDT    Performed On:  07/28/2019 13:37 EDT by Ladell Heads, RN, Selinda Orion               DC Information   Discharge To :   Home independently   Mode of Discharge :   Wheelchair   Transportation :   Private vehicle   Accompanied By :   Neville Route, RN, Selinda Orion - 07/28/2019 13:37 EDT

## 2019-07-28 NOTE — Op Note (Signed)
Operative Report    [place]  Allison Laming, MD  Service Date: 07/28/2019    PREOPERATIVE DIAGNOSIS:  Symptomatic hemorrhoids.    POSTOPERATIVE DIAGNOSIS:  Symptomatic hemorrhoids.    PROCEDURE PERFORMED:  Two-quadrant hemorrhoidectomy with hemorrhoid  ablation.    INDICATIONS:  This is a 68 year old female with prolapsing internal  and external hemorrhoids.    DESCRIPTION OF PROCEDURE:  The patient was brought to the operating  room, given sedation, placed in the prone jackknife position.  We  sterilely prepped and draped the perianal region, then anesthetized  the perianal skin and deep sphincter muscles with a combination of 1%  lidocaine with epinephrine and 0.5% Marcaine with epinephrine,  bicarbonate solution.  Once we achieved a good block, we could see  that the 2 main culprit complexes were the right posterolateral and  right anterior complexes.  I first worked on the right posterolateral  complex.  I placed two figure-of-eight stitches of 2-0 chromic at the  internal apex.  I then excised the entire internal/external complex  staying above the sphincters.  The anal canal mucosa and perianal skin  was then closed with a combination of interrupted and running locking  2-0 chromic stitches.  In the same fashion, the right anterior complex  was removed.  I checked the rest of the anal canal.  There was a small  internal complexes posteriorly and then the left lateral position,  which I proceeded to ablate.  We made a final check for hemostasis and  found that it was excellent, and this completed the procedure.    SPECIMENS:  Hemorrhoids.    ESTIMATED BLOOD LOSS:  Minimal.      Allison Laming, MD  TR: tn DD: 07/28/2019 13:30 TD: 07/28/2019 13:34 Job#: [id number]  \\[id number]\\DOC#: [id number]  \\[id number]      cc:  Emilia Beck MD  Signature Line    Electronically Signed on 07/28/2019 03:23 PM EDT  ________________________________________________  Scot Dock              Reviewed by: Marcell Barlow

## 2019-07-28 NOTE — Anesthesia Pre-Procedure Evaluation (Signed)
Postanesthesia Evaluation        Patient:   Allison Warner, Allison Warner             MRN: [id number]            FIN: [id number]               Age:   68 years     Sex:  Female     DOB:  10/10/1951   Associated Diagnoses:   None   Author:   Felicity Coyer,  Elisa Sorlie JR      Postoperative Information   Post Operative Info:          Post operative day: Post Anesthesia Care Unit.         Patient location: PACU.       Assessment   Postanesthesia assessment   Vitals: Vital Signs   37/10/6965 89:38 EDT Systolic Blood Pressure 101 mmHg    Diastolic Blood Pressure 55 mmHg  LOW    Temperature Temporal Artery 37.1 degC    Heart Rate Monitored 86 bpm    Respiratory Rate 18 br/min    SpO2 96 %    SBP/DBP Cuff Details Right arm   75/10/256 52:77 EDT Systolic Blood Pressure 824 mmHg    Diastolic Blood Pressure 77 mmHg    Temperature Oral 36.5 degC    Heart Rate Monitored 83 bpm    Respiratory Rate 20 br/min    SpO2 98 %      .     Signature Line     Electronically Signed on 07/28/2019 01:35 PM EDT   ________________________________________________   Jolaine Click

## 2019-07-28 NOTE — Op Note (Signed)
Phase II Record - [facility]             Phase II Record - [facility] Summary                                                                  Primary Physician:        Scot Dock    Case Number:              [id number]    Finalized Date/Time:      07/28/19 14:22:12    Pt. Name:                 Allison Warner, Allison Warner    D.O.B./Sex:               1951-08-11    Female    Med Rec #:                [id number]    Physician:                Scot Dock    Financial #:              [id number]    Pt. Type:                 S    Room/Bed:                 /    Admit/Disch:              07/28/19 08:21:00 -    Institution:       [facility] Case Attendance - Phase II                                                                                           Entry 1                         Entry 2                                                                          Case Attendee             Veto Kemps, RN, Evette Doffing  P    Role Performed            Surgeon Primary                 Post Anesthesia Care                                                              Nurse    Time In     Time Out     Last Modified By:         Varricchio, RN, Adonis Brook, RNSelinda Orion 07/28/19 14:02:48             P 07/28/19 14:03:46      [facility] Case Attendance - Phase II Audit                                                            07/28/19 14:03:46         Owner: [id number]                              Modifier: [id number]                                                       <+> 2         Case Attendee        <+> 2         Role Performed        [facility] - Case Times - Phase II                                                                                              Entry 1  Phase II In               07/28/19 13:48:00                Phase II Out                    07/28/19 14:20:00    Phase II Discharge        07/28/19 14:20:00    Time     Last Modified By:         Ladell HeadsVarricchio, RN, Selinda OrionVincent                              P 07/28/19 14:22:08              Finalized By: Ladell HeadsVarricchio, RN, Selinda OrionVincent P      Document Signatures                                                                             Signed By:           Ladell HeadsVarricchio, RN, Selinda OrionVincent P 07/28/19 14:22

## 2019-07-28 NOTE — Discharge Summary (Signed)
Inpatient Clinical Summary             [facility]  Post-Acute Care Transfer Instructions  PERSON INFORMATION   Name: MIIA, BLANKS  MRN: [id number]    FIN#: [id number]   PHYSICIANS  Admitting Physician: Scot Dock  Attending Physician: Scot Dock   PCP: Scot Dock  Discharge Diagnosis:    Comment:       PATIENT EDUCATION INFORMATION  Instructions:             Post op anorectal surgery [phone]); Understanding Hemorrhoids; Treating Hemorrhoids: Self-Care (FORDTE); Monitored Anesthesia Care (MAC) (APPIKI)  Medication Leaflets:               Follow-up:                           With: Address: When:   [facility] [address], [place] [address]  [phone] Business (1)    Comments:   per instructions       With: Address: When:   [facility] [place], [place] [address]  [phone] Business (1)                              MEDICATION LIST  Medication Reconciliation at Discharge:          New Medications  Printed Prescriptions  acetaminophen-oxyCODONE (Percocet 5/325 oral tablet) 1 Tabs Oral (given by mouth) every 6 hours as needed moderate pain (4-7) for 5 Days. Refills: 0., MAX DAILY DOSE OF ACETAMINOPHEN = 4000 MG  Last Dose:____________________  ketorolac (ketorolac 10 mg oral tablet) 1 Tabs Oral (given by mouth) 4 times a day for 3 Days. not to exceed 40 mg/day and 5 days duration for all dose forms. Refills: 0.  Last Dose:____________________  Medications That Were Updated - Follow Current Instructions  Other Medications  Current dulaglutide (Trulicity Pen 1.5 QA/8.3 mL subcutaneous solution) 1.5 Milligram Subcutaneous (under the skin) Every Wednesday.  Last Dose:____________________    Medications that have not changed  Other Medications  cetirizine (ZyrTEC 10 mg oral tablet) 1 Tabs Oral (given by mouth) every day.  Last Dose:____________________  metFORMIN (metFORMIN 500 mg oral tablet) 2 Tabs Oral (given  by mouth) once a day (in the evening).  Last Dose:____________________  metFORMIN (metFORMIN 500 mg oral tablet) 1 Tabs Oral (given by mouth) once a day (in the morning).  Last Dose:____________________  multivitamin with minerals (Multi-Day Plus Minerals oral tablet) 1 Tabs Oral (given by mouth) every day.  Last Dose:____________________  simvastatin (simvastatin 20 mg oral tablet) 1 Tabs Oral (given by mouth) Once a Day (at bedtime).  Last Dose:____________________         Patient???s Final Home Medication List Upon Discharge:           acetaminophen-oxyCODONE (Percocet 5/325 oral tablet) 1 Tabs Oral (given by mouth) every 6 hours as needed moderate pain (4-7) for 5 Days. Refills: 0., MAX DAILY DOSE OF ACETAMINOPHEN = 4000 MG  cetirizine (ZyrTEC 10 mg oral tablet) 1 Tabs Oral (given by mouth) every day.  dulaglutide (Trulicity Pen 1.5 MH/9.6 mL subcutaneous solution) 1.5 Milligram Subcutaneous (under the skin) Every Wednesday.  ketorolac (ketorolac 10 mg oral tablet) 1 Tabs Oral (given by mouth) 4 times a day for 3 Days. not to exceed 40 mg/day and 5 days duration for all dose forms. Refills: 0.  metFORMIN (metFORMIN 500 mg oral tablet) 2 Tabs Oral (given by mouth) once a day (in the evening).  metFORMIN (metFORMIN 500 mg oral tablet) 1 Tabs Oral (given by mouth) once a day (in the morning).  multivitamin with minerals (Multi-Day Plus Minerals oral tablet) 1 Tabs Oral (given by mouth) every day.  simvastatin (simvastatin 20 mg oral tablet) 1 Tabs Oral (given by mouth) Once a Day (at bedtime).         Comment:       ORDERS          Order Name Order Details   Discharge Patient 07/28/19 13:36:00 EDT, Discharge Home/Self Care

## 2019-07-28 NOTE — Case Communication (Signed)
Discharge Follow-Up Form - Text       Discharge Follow-Up Entered On:  07/30/2019 11:07 EDT    Performed On:  07/30/2019 11:07 EDT by Simonne Martinet, RN, Nelda Bucks               Clinical   Provider Follow-Up Post Discharge RTF :   Follow-Up Appointments    With:  [name]  Address:  business (1), [address], [place], [place], [address];[phone] Business (1)  Comment:  per instructions    With:  [name]  Address:  business (1), [address], [place], [place], [address];[phone] Business (1)       [place], RN, Nelda Bucks - 07/30/2019 11:07 EDT   Surgery Evaluation   CM Surg DC Instr Clr :   Yes   CM Surg Pain Controlled :   Yes   CM Surg Nausea/Vomiting :   No   CM Surg FU Appt :   Yes   CM Surg Staff Recognize :   No   Simonne Martinet RNNelda Bucks - 07/30/2019 11:07 EDT   Status   Case Status :   Completed   Simonne Martinet, RN, Nelda Bucks - 07/30/2019 11:07 EDT

## 2019-07-28 NOTE — Procedures (Signed)
IntraOp Record - [facility]             Int[facility]Op Record - [facility] Summary                                                                   Primary Physician:        Scot Dock    Case Number:              [id number]    Finalized Date/Time:      07/28/19 13:28:07    Pt. Name:                 Allison Warner, Allison Warner    D.O.B./Sex:               1951-03-02    Female    Med Rec #:                [id number]    Physician:                Scot Dock    Financial #:              [id number]    Pt. Type:                 S    Room/Bed:                 /    Admit/Disch:              07/28/19 08:21:00 -    Institution:       [facility] - Case Times                                                                                                         Entry 1                                                                                                          Patient      In Room Time             07/28/19 12:49:00               Out Room Time                   07/28/19 13:27:00    Anesthesia     Procedure  Start Time               07/28/19 13:03:00               Stop Time                       07/28/19 13:24:00    Last Modified By:         York Spaniel                              07/28/19 13:27:47      [facility] - Case Times Audit                                                                          07/28/19 13:27:47         Owner: [id number]                              Modifier: [id number]                                                       <+> 1         Out Room Time        <+> 1         Stop Time        [facility] - Case Attendance                                                                                                    Entry 1                         Entry 2                         Entry 3                                          Case Attendee             HUNTLEY-CRNA,  KACY R           FIRILAS-MD,  ANTHONY            SOKEVITZ-MD,  [place]    Role Performed            CRNA  Surgeon  Primary                 Anesthesiologist    Time In     Time Out     Procedure                 Hemorrhoidectomy                Hemorrhoidectomy                Hemorrhoidectomy    Last Modified By:         Eda Keys, RN, Corbin Ade, RN,                Boger, RNOtelia Limes                              07/28/19 12:34:15               Rosemarie Beath 07/28/19             07/28/19 12:32:53                                                              12:28:57                                Entry 4                         Entry 5                                                                          Case Attendee             Boger, RN, Geralyn Flash,  [place]    Role Performed            Circulator                      Surgical Scrub    Time In     Time Out     Procedure                 Hemorrhoidectomy                Hemorrhoidectomy    Last Modified By:         Eda Keys RN, Faye Ramsay, RNOtelia Limes                              07/28/19 12:32:53               07/28/19 12:32:53      [facility] - Case  Attendance Audit                                                                     07/28/19 12:34:15         Owner: [id number]                              Modifier: [id number]                                                           1     <*> Case Attendee                          Quentin Mulling            1     <*> Procedure                              Hemorrhoidectomy     07/28/19 12:32:53         Owner: [id number]                               Modifier: [id number]                                                           1     <*> Case Attendee                          BUGHER-CAA,  BRADLEY R            1     <*> Procedure                              Hemorrhoidectomy        <+> 3         Case Attendee        <+> 3         Role Performed        <+> 3         Procedure        <+> 4         Case Attendee        <+> 4         Role Performed        <+> 4         Procedure        <+> 5          Case Attendee        <+> 5         Role Performed        <+>  5         Procedure        [facility] - Skin Assessment                                                                          Pre-Care Text:            A.240 Assesses baseline skin condition  Im.120 Implements protective measures to prevent skin or tissue injury           due to mechanical sources   Im.280.1 Implements progective measures to prevent skin or tissue injury due to           thermal sources  Im.360 Monitors for signs and symptons of infection                              Entry 1                                                                                                          Skin Integrity            Intact    Last Modified By:         Eda Keys RNOtelia Limes                              07/28/19 12:37:05    Post-Care Text:            E.10 Evaluates for signs and symptoms of physical injury to skin and tissue  E.270 Evaluate tissue perfusion           O.60 Patient is free from signs and symptoms of injury caused by extraneous objects    O.210 Patinet's tissue           perfusion is consistent with or improved from baseline levels      [facility] - Patient Positioning                                                                      Pre-Care Text:            A.240 Assesses baseline skin condition  A.280 Identifies baseline musculoskeletal status  A.280.1 Identifies           physical alterations that require additional precautions for procedure-specific positioning  A.510.8 Maintains           patient's dignity and privacy  Im.120 Implements protective measures  to prevent skin/tissue injury due to           mechanical sources  Im.40 Positions the patient  Im.80 Applies safety devices                              Entry 1                                                                                                          Procedure                 Hemorrhoidectomy                Body Position                   Jackknife    Left Arm  Position         Flexed on Padded Arm            Right Arm Position              Flexed                              Board w/Security Strap    Left Leg Position         Extended Security               Right Leg Position              Extended Security                              Strap, Pillow Under                                             Strap, Pillow Under                              Knee, Pillow Under                                              Knee, Pillow Under                              Lower Leg                                                       Lower Leg    Feet Uncrossed  Yes                             Pressure Points                 Yes                                                              Checked     Positioning Device        Pillow, Rolled Blanket          Positioned By                   [employer], RN, Otelia Limes,                                                                                              HUNTLEY-CRNA,  KACY R    Outcome Met (O.80)        Yes    Last Modified By:         Eda Keys RNOtelia Limes                              07/28/19 12:36:27    Post-Care Text:            E.10 Evaluates for signs and symptoms of physical injury to skin and tissue  E.290 Evaluates musculoskeletal           status  O.80 Patient is free from signs and symptoms of injury related to positioning  O.120 the patient is           free from signs and symptoms of injury related to transfer/transport   O.250 Patient's musculoskeletal status           is maintained at or improved from baseline levels      [facility] - Skin Prep                                                                                Pre-Care Text:            A.30 Verifies allergies  A.20 Verifies procedure, surgical site, and laterality  A.510.8 Maintains paritnet's           dignity and privacy  Im.270 Performs Skin Preparation  Im.270.1 Implements protective measures to prevent skin           and tissue injury due to chemical  sources   A.300.1 Protects from cross-contamination  Entry 1                                                                                                          Hair Removal     Skin Prep      Prep Agents (Im.270)     Povidone Iodine                 Prep Area (Im.270)              Anus and Buttocks                              Solution 10%     Prep By                  Scot Dock    Outcome Met (O.100)       Yes    Last Modified By:         York Spaniel                              07/28/19 12:37:16    Post-Care Text:            E.10 Evaluates for signs and symptoms of physical injury to skin and tissue  O.100 Patient is free from signs           and symptoms of chemical injury   O.740 The patient's right to privacy is maintained      [facility] - Counts Initial and Final                                                                 Pre-Care Text:            A.27 Verifies operative procedure, sugical site, and laterality  A.20.2 Assesses the risk for unintended           retained foreign body  Im.20 Performs required counts                              Entry 1  Initial Counts      Initial Counts           Boger, RN, Otelia Limes,           Items included in               Sponges, Sharps     Performed By             Laurin Coder            the Initial Count     Final Counts      Final Counts             Boger, RN, Otelia Limes,           Final Count Status              Correct     Performed By             Marvell Fuller,  BRIDGETTE     Items Included in        Sponges, Sharps     Final Count     Outcome Met (O.20)        Yes    Last Modified By:         Eda Keys RNOtelia Limes                              07/28/19 13:27:50    Post-Care Text:            E.50 Evaluates results of the surgical count  O.20 Patient is free from unintended retained foreign objects      [facility] - Counts  Initial and Final Audit                                                            07/28/19 13:27:50         Owner: [id number]                              Modifier: [id number]                                                       <+> 1         Outcome Met (O.20)        [facility] - Counts Additional                                                                        Pre-Care Text:            A.61 Verifies operative procedure, sugical site, and laterality  A.20.2 Assesses the risk for unintended           retained foreign body  Im.20 Performs required counts  Entry 1                                                                                                          Additional Count          Closing Count                   Additional Count                [place],  [place],    Type                                                      Participants                    Boger, RN, Otelia Limes    Count Status              Correct                         Items Counted                   Sponges, Sharps    Outcome Met (O.20)        Yes    Last Modified By:         Eda Keys RNOtelia Limes                              07/28/19 13:27:55    Post-Care Text:            E.50 Evaluates results of the surgical count  O.20 Patient is free from unintended retained foreign objects      [facility] - Counts Additional Audit                                                                   07/28/19 13:27:55         Owner: [id number]                              Modifier: [id number]                                                       <+> 1         Outcome Met (O.20)        [facility] - General Case Data  Pre-Care Text:            A.350.1 Classifies surgical wound                              Entry 1                                                                                                          Case Information      ASA Class                2                                Case Level                      Level 2     OR                       MP 01                           Specialty                       General (SN)     Wound Class              2-Clean-Contaminated    Preop Diagnosis           K64.2                           Postop Diagnosis                K64.2    Last Modified By:         York Spaniel                              07/28/19 13:05:44    Post-Care Text:            O.760 Patient receives consistent and comparable care regardless of the setting      [facility] - Fire Risk Assessment                                                                                               Entry 1  Fire Risk                 Open Oxygen Source,             Fire Risk Score                 2    Assessment: If            Ignition Source In Use    checked, checkmark     = 1 point     Last Modified By:         Eda Keys RNOtelia Limes                              07/28/19 12:35:24      [facility] - Time Out                                                                                 Pre-Care Text:            A.10 Confirms patient identity  A.20 Verifies operative procedure, surgical site, and laterality  A.20.1           Verifies consent for planned procedure  A.30 Verifies allergies                              Entry 1                                                                                                          Procedure                 Hemorrhoidectomy                Is everyone ready               Yes                                                              to perform time out     Have all members of       Yes                             Patient name and                Yes    the surgical team  DOB confirmed     been introduced     Allergies discussed       Yes                             Surgical procedure              Yes                                                               to be performed                                                               confirmed and                                                               verified by                                                               completed surgical                                                               consent     Correct surgical          Yes                             Correct laterality              Yes    site marked and                                           confirmed     initials are     visible through     prepped and draped     field (or     alternative ID band     used)     Correct patient           Yes                             Surgeon shares                  Yes    position  confirmed                                        operative plan,                                                               possible                                                               difficulties,                                                               expected duration,                                                               anticipated blood                                                               loss and reviews                                                               all                                                               critical/specific                                                               concerns     Required blood            Yes                             Essential imaging  Yes    products, implants,                                       available and fetal     devices and/or                                            heartones confirmed     special equipment                                         (if applicable)     available and     sterility confirmed     VTE prophylaxis           Yes                             Antibiotics ordered             Yes    addressed                                                  and administered     Anesthesia shares         Yes                             Fire risk                       Yes    anesthetic plan and                                       assessment scored     reviews patient                                           and plan discussed     specific concerns     Appropriate drying        Yes                             Surgeon states:                 Yes    time for prep                                             Does anyone have     observed before  any concerns? If     draping                                                   you see, suspect,                                                               or feel that                                                               patient care is                                                               being compromised,                                                               speak up for                                                               patient safety     Time Out Complete         07/28/19 13:02:00    Last Modified By:         York Spaniel                              07/28/19 13:06:22    Post-Care Text:            E.30 Evaluates verification process for correct patient, site, side, and level surgery      [facility] - Time Out Audit                                                                            07/28/19 13:06:22         Owner: [id number]  Modifier: [id number]                                                           1     <*> Procedure                              Hemorrhoidectomy            1     <+> Time Out Complete        [facility] - Debrief                                                                                  Pre-Care Text:            Im.330 Manages specimen handling and disposition                              Entry 1                                                                                                           Procedure                 Hemorrhoidectomy                Final counts                    Yes                                                              correct and                                                               verbally verified  with                                                               surgeon/licensed                                                               independent                                                               practitioner (if                                                               applicable)     Actual procedure          Yes                             Postop diagnosis                Yes    performed confirmed                                       confirmed     Wound                     Yes                             Confirm specimens               Yes    classification                                            and specimens     confirmed                                                 labeled                                                               appropriately (if  applicable)     Foley catheter            Yes                             Patient recovery                Yes    removed (if                                               plan confirmed     applicable)     Debrief Complete          07/28/19 13:27:00    Last Modified By:         Eda Keys RNOtelia Limes                              07/28/19 13:28:05    Post-Care Text:            E.800 Ensures continuity of care  E.50 Evaluates results of the surgical count  O.30 Patient's procedure is           performed on the correct site, side, and level  O.50 patient's current status is communicated throughout the           continuum of care  O.40 Patient's specimen(s) is managed in the appropriate manner      [facility] - Debrief Audit                                                                              07/28/19 13:28:05         Owner: [id number]                              Modifier: [id number]                                                           1     <*> Procedure                              Hemorrhoidectomy            1     <+> Debrief Complete        [facility] - Cautery  Pre-Care Text:            A.240 Assesses baseline skin condition  A280.1 Identifies baseline musculoskeletal status  Im.50 Implements           protective measures to prevent injury due to electrical sources   Im.60 Uses supplies and equipment within safe           parameters  Im.80 Applies safety devices                              Entry 1                                                                                                          ESU Type                  GENERATOR                       Identification                  [id number]                              COVIDIEN/[facility]LEYLAB              Number     Coag Setting (watts)      40                              Cut Setting (watts)             40    Grounding Pad             Yes                             Grounding Pad Site              Back left    Needed?     Grounding [employer], RN, Otelia Limes            ESU Comment                     [id number] [phone]    Applied By     Outcome Met (O.10)        Yes    Last Modified By:         Eda Keys RNOtelia Limes                              07/28/19 13:21:38    Post-Care Text:            E.10 Evaluates for signs and symptoms of physical injury to skin and tissue  O.10 Patient is free from signs  and symptoms of injury related to thermal sources   O.70 Patient is free from signs and symptoms of electrical           injury      [facility] - Cautery Audit                                                                             07/28/19 13:21:38         Owner: [id number]                              Modifier: [id number]                                                            1     <*> ESU Type                               GENERATOR COVIDIEN/VALLEYLAB            1     <*> Grounding Pad Site                     Thigh, left        [facility] - Patient Care Devices                                                                     Pre-Care Text:            A.200 Assesses risk for normothermia regulation  A.40 Verifies presence of prosthetics or corrective devices           Im.80 Implements thermoregulation measures  Im.60 Uses supplies and equipment within safe parameters                              Entry 1                                                                                                          Equipment Type            MACHINE SEQUENTIAL              SCD Sleeve Site  Legs Bilateral                              COMPRESSION    Equipment/Tag Number      [id number]                       Initiated Pre                   Yes                                                              Induction     Last Modified By:         Eda Keys RNOtelia Limes                              07/28/19 12:36:10    Post-Care Text:            E.10 Evaluates signs and symptoms of physical injury to skin and tissue  O.60 Patient is free from signs and           symptoms of injury caused by extraneous objects      [facility] - Medications                                                                              Pre-Care Text:            A.10 Confirms patient identity  A.30 Verifies allergies  Im.220 Administers prescribed medications  Im.220.2           Administers prescribed antibiotic therapy as ordered                              Entry 1                         Entry 2                         Entry 3                                          Time Administered     Medication                BUPIVACAINE 0.5%                LIDOCAINE 1% W/EPI 20ML         SODIUM BICARBONATE 4.2%                              EPINEPHRINE INJECTION           VIAL  NEUT 5ML VIAL                              30ML    Route of Admin            Local Injection                 Local Injection                 Local Injection    Dose/Volume     (include amount and     unit of measure)     Site                      Rectum                          Rectum                          Rectum    Site Detail     Administered By           Margarita Grizzle,  ANTHONY    Outcome Met (O.130)       Yes                             Yes                             Yes    Last Modified By:         Eda Keys, RN, Faye Ramsay, RN, Faye Ramsay, RNOtelia Limes                              07/28/19 12:35:44               07/28/19 12:35:44               07/28/19 12:35:44    Post-Care Text:            E.20 Evaluates response to medications  O.130 Patient receives appropriately administerd medication(s)      [facility] - Specimens                                                                                                          Entry 1  Description               A - Hemorrhoids                 Specimen Type                   Routine    Last Modified By:         York Spaniel                              07/28/19 13:21:49      [facility] - Dressing/Packing                                                                         Pre-Care Text:            A.350 Assesses susceptibility for infection  Im.250 Administers care to invasive devices  Im.290 Administer           care to wound sites   Im.300 Implements aseptic technique                              Entry 1                                                                                                          Site                      Anus/Buttocks    Dressing Item     Details      Dressing Item            Fluffs, Medicated Gauze         Miscellaneous                    Other (See Comment)     (Im.290)                                                 (Im.290)     Last Modified By:         York Spaniel                              07/28/19 12:35:11    Post-Care Text:            E.320 Evaluate factors associted with increased risk for postoperative infection at the completion of the           procedure  O.200 Patient's wound perfusion is consistent with or improved from baseline levels   O.Patient is           free from signs and symptoms of infection      [facility] - Procedures                                                                               Pre-Care Text:            A.20 Verifies operative procedure, surgical site, and laterality  Im.150 Develops individualized plan of care                              Entry 1                                                                                                          Procedure     Description      Procedure                Hemorrhoidectomy                Surgical Procedure              HEMORRHOIDECTOMY                                                              Text     Primary Procedure         Yes                             Primary Surgeon                 Scot Dock    Start                     07/28/19 13:03:00               Stop                            07/28/19 13:24:00    Anesthesia Type           Monitored Anesthesia            Surgical Service                General (SN)  Care    Wound Class               2-Clean-Contaminated    Last Modified By:         York Spaniel                              07/28/19 13:27:48    Post-Care Text:            O.730 The patinet's care is consistent with the individualized perioperative plan of care      MP[facility] - Procedures Audit                                                                          07/28/19 13:27:48         Owner: [id number]                              Modifier: [id number]                                                        <+> 1         Stop        MP[facility] - Transfer                                                                                                           Entry 1                                                                                                          Transferred By            Eda Keys, RN, Otelia Limes,           Via                             Stretcher                              [facility]UN[facility]-CRNA,  [facility] R    Post-op Destination       PACU    Skin Assessment      Condition                Intact    Last Modified By:         York Spaniel                              07/28/19 12:37:46      Case Comments                                                                                         <None>              Finalized By: Eda Keys RN, Otelia Limes      Document Signatures                                                                             Signed By:           York Spaniel 07/28/19 13:28

## 2019-07-28 NOTE — Assessment & Plan Note (Signed)
PreOp Record - [facility]             PreOp Record - [facility] Summary                                                                     Primary Physician:        Scot Dock    Case Number:              [id number]    Finalized Date/Time:      07/28/19 12:02:35    Pt. Name:                 Allison Warner, Allison Warner    D.O.B./Sex:               10-13-1951    Female    Med Rec #:                [id number]    Physician:                Scot Dock    Financial #:              [id number]    Pt. Type:                 S    Room/Bed:                 /    Admit/Disch:              07/28/19 08:21:00 -    Institution:       [facility] Case Attendance - Preop                                                                                              Entry 1                         Entry 2                                                                          Case Attendee             Kennedy Bucker, RN, Roberts Gaudy    Role Performed            Surgeon Primary                 Preoperative Nurse    Time In  07/28/19 11:23:00    Time Out     Last Modified By:         Cyril Mourning, RN, Clydene Laming, RNLeitha Bleak                              07/28/19 11:19:14               07/28/19 11:19:14      MP[facility] - Case Times - PreOp                                                                                                 Entry 1                                                                                                          Patient In Room Time      07/28/19 11:23:00               Nurse In Time                   07/28/19 11:23:00    Nurse Out Time            07/28/19 11:55:00               Patient Ready for               07/28/19 11:55:00                                                              Surgery/Procedure     Last Modified By:         Cyril Mourning RN, Leitha Bleak                              07/28/19 12:02:32      MP[facility] - Case Times - PreOp Audit                                                                   07/28/19 12:02:32  Owner: [id number]                              Modifier: [id number]                                                       <+> 1         Patient Ready for Surgery/Procedure        <+> 1         Nurse Out Time                Finalized By: Cyril MourningLarson, RN, Leitha Bleakheryl L      Document Signatures                                                                             Signed By:           Cyril MourningLarson, RN, Leitha Bleakheryl L 07/28/19 12:02

## 2019-07-28 NOTE — Discharge Summary (Signed)
Inpatient Patient Summary                 [facility]  [address] [place] [address]  [phone]  Patient Discharge Instructions     Name: Allison Warner, Allison Warner  Current Date: 07/28/2019 13:42:13  DOB: June 04, 1951 [id number] FIN:NBR%>[phone]  Patient Address: [place] [address]  Patient Phone: [phone]  Primary Care Provider:  Name: Scot Dock  Phone: [phone]  Immunizations Provided:      Discharge Diagnosis:   Discharged To: TO, ANTICIPATED%>  Home Treatments: TREATMENTS, ANTICIPATED%>  Devices/Equipment: EQUIPMENT REHAB%>  Post Hospital Services: HOSPITAL SERVICES%>  Professional Skilled Services: SKILLED SERVICES%>  Education administrator and Community Resources: SERV AND COMM RES, ANTICIPATED%>  Mode of Discharge Transportation: TRANSPORTATION%>Private vehicle  Discharge Orders:          Discharge Patient 07/28/19 13:36:00 EDT, Discharge Home/Self Care         Comment:   Medications  During the course of your visit, your medication list was updated with the most current information. The details of those changes are reflected below:          New Medications  Printed Prescriptions  acetaminophen-oxyCODONE (Percocet 5/325 oral tablet) 1 Tabs Oral (given by mouth) every 6 hours as needed moderate pain (4-7) for 5 Days. Refills: 0., MAX DAILY DOSE OF ACETAMINOPHEN = 4000 MG  Last Dose:____________________  ketorolac (ketorolac 10 mg oral tablet) 1 Tabs Oral (given by mouth) 4 times a day for 3 Days. not to exceed 40 mg/day and 5 days duration for all dose forms. Refills: 0.  Last Dose:____________________  Medications That Were Updated - Follow Current Instructions  Other Medications  Current dulaglutide (Trulicity Pen 1.5 JI/9.6 mL subcutaneous solution) 1.5 Milligram Subcutaneous (under the skin) Every Wednesday.  Last Dose:____________________    Medications that have not changed  Other Medications  cetirizine (ZyrTEC 10 mg oral tablet) 1 Tabs Oral (given by  mouth) every day.  Last Dose:____________________  metFORMIN (metFORMIN 500 mg oral tablet) 2 Tabs Oral (given by mouth) once a day (in the evening).  Last Dose:____________________  metFORMIN (metFORMIN 500 mg oral tablet) 1 Tabs Oral (given by mouth) once a day (in the morning).  Last Dose:____________________  multivitamin with minerals (Multi-Day Plus Minerals oral tablet) 1 Tabs Oral (given by mouth) every day.  Last Dose:____________________  simvastatin (simvastatin 20 mg oral tablet) 1 Tabs Oral (given by mouth) Once a Day (at bedtime).  Last Dose:____________________      [facility] would like to thank you for allowing [place] to assist you with your healthcare needs. The following includes patient education materials and information regarding your injury/illness.  Allison Warner has been given the following list of follow-up instructions, prescriptions, and patient education materials:  Follow-up Instructions:              With: Address: When:   [facility] [address] [place] [address]  [phone] Business (1)    Comments:   per instructions       With: Address: When:   Bernis Stecher-MD [address], [place] [address]  [phone] Business (1)                   It is important to always keep an active list of medications available so that you can share with other providers and manage your medications appropriately. As an additional courtesy,  we are also providing you with your final active medications list that you can keep with you.           acetaminophen-oxyCODONE (Percocet 5/325 oral tablet) 1 Tabs Oral (given by mouth) every 6 hours as needed moderate pain (4-7) for 5 Days. Refills: 0., MAX DAILY DOSE OF ACETAMINOPHEN = 4000 MG  cetirizine (ZyrTEC 10 mg oral tablet) 1 Tabs Oral (given by mouth) every day.  dulaglutide (Trulicity Pen 1.5 HU/3.1 mL subcutaneous solution) 1.5 Milligram Subcutaneous (under the skin) Every  Wednesday.  ketorolac (ketorolac 10 mg oral tablet) 1 Tabs Oral (given by mouth) 4 times a day for 3 Days. not to exceed 40 mg/day and 5 days duration for all dose forms. Refills: 0.  metFORMIN (metFORMIN 500 mg oral tablet) 2 Tabs Oral (given by mouth) once a day (in the evening).  metFORMIN (metFORMIN 500 mg oral tablet) 1 Tabs Oral (given by mouth) once a day (in the morning).  multivitamin with minerals (Multi-Day Plus Minerals oral tablet) 1 Tabs Oral (given by mouth) every day.  simvastatin (simvastatin 20 mg oral tablet) 1 Tabs Oral (given by mouth) Once a Day (at bedtime).      Take only the medications listed above. Contact your doctor prior to taking any medications not on this list.  Discharge instructions, if any, will display below     Instructions for Diet: INSTRUCTIONS FOR DIET%>  Instructions for Supplements: SUPPLEMENT INSTRUCTIONS%>  Instructions for Activity: INSTRUCTIONS FOR ACTIVITY%>  Instructions for Wound Care: INSTRUCTIONS FOR WOUND CARE%>     Medication leaflets, if any, will display below    Patient education materials, if any, will display below           Remove dressings in AM then warm soaks in the tub with a capful of epsom salts three times per day or more if desired    Citrucel/Metamucil or konsyl  1 tablespoon in 8 ounces of water or drink of choice with 334m colace 1 time per day same time every day    If no BM in 4 days milk of magnesia 4 tablespoons per day    Some bleeding is normal    Take medications as prescribed see prescriptions    Follow up in office in 10 to 14 days     Call office for any issues or questions                Understanding Hemorrhoids      Hemorrhoid tissues are ?cushions? of blood vessels that swell slightly during bowel movements. Too much pressure on the anal canal can make these tissues remain enlarged, become inflamed, and cause symptoms. This can happen??both inside and outside the anal canal.   Parts of the anal canal   The parts of the anal canal  are:     ??Internal hemorrhoid tissue??is in the upper area of the anal canal.     ??The rectum??is the last several inches of the colon. This is where stool is stored prior to bowel movements.     ??Anal sphincters??are ring-shaped muscles that expand and contract to control the anal opening.     ??External hemorrhoid tissue??lies under the anal skin.     ??The anus??is the passage between the rectum and the outside of the body.    Normal hemorrhoid tissue   Hemorrhoid tissues play an important role in helping your body eliminate waste. Food passes from the stomach through the intestines. The waste (stool) then travels through the  colon to the rectum. It is stored in the rectum until it?s ready to be passed from the anus. During bowel movements, hemorrhoids swell with blood and become slightly larger. This swelling helps protect and cushion the anal canal as stool passes from the body. Once the stool has passed, the tissues stop swelling and return to normal.   Problem hemorrhoids   Pressure due to straining or other factors can cause hemorrhoid tissues to remain swollen. When this happens to the hemorrhoid tissues in the anal canal they?re called internal hemorrhoids. Swollen tissues around the anal opening are called external hemorrhoids. Depending on the location, your symptoms can differ.     ??Internal hemorrhoids??often happen in clusters around the wall of the anal canal. They are usually painless. But they may prolapse (protrude out of the anus) due to straining or pressure from hard stool. After the bowel movement is over, they may then reduce (return inside the body). Internal hemorrhoids often bleed. They can also discharge mucus.     ??   External hemorrhoids??are located at the anal opening, just beneath the skin. These tissues rarely cause problems unless they thrombose (form a blood clot). When this happens, a hard, bluish lump may appear. A thrombosed hemorrhoid also causes sudden, severe pain. In time, the clot  may go away on its own. This sometimes leaves a ?skin tag? of tissue stretched by the clot.    Hemorrhoid symptoms   Hemorrhoid symptoms may include:     ??Pain or a burning sensation     ??Bleeding during bowel movements     ??Protrusion of tissue from the anus     ??Itching around the anus    Causes of hemorrhoids   There?s no single cause of hemorrhoids. Most often, though, they are caused by too much pressure on the anal canal. This can be due to:     ??Chronic (ongoing) constipation     ??Straining during bowel movements     ??Sitting too long on the toilet     ??Strenuous exercise or heavy lifting     ??Pregnancy and childbirth     ??Aging     ??Diarrhea       ?? 2000-2020 [employer]. [address] [place] [address]. All rights reserved. This information is not intended as a substitute for professional medical care. Always follow your healthcare professional's instructions.         Treating Hemorrhoids: Self-Care      Follow your healthcare providers advice about caring for your hemorrhoids at home. Some treatments help relieve symptoms right away. Others involve making changes in your diet and exercise habits. These can help ease constipation and prevent hemorrhoid symptoms from coming back.   Relieving symptoms   Your healthcare provider may prescribe anti-inflammatory medicine to help ease your symptoms. The following tips will also help relieve pain and swelling.   ?? Take sitz baths. Taking a sitz bath means sitting in a few inches of warm bath water. Soaking for 10 minutes twice a day can provide welcome relief from painful hemorrhoids. It can also help the area stay clean.   ?? Develop good bowel habits. Use the bathroom when you need to. Dont ignore the urge to move your bowels. This can lead to constipation, hard stools, and straining. Also, dont read while on the toilet. Sit only as long as needed. Wipe gently with soft, unscented toilet tissue or baby wipes.   ?? Use ice packs. Placing an  ice pack on a thrombosed external hemorrhoid can help relieve pain right away. It will also help reduce the blood clot. Use the ice for 15 to 20 minutes at a time. Keep a cloth between the ice and your skin to prevent skin damage.   ?? Use other measures. Laxatives and enemas can help ease constipation. But use them only on your healthcare providers advice. For symptom relief, try using cotton pads soaked in witch hazel. These are available at most drugstores. Over-the-counter hemorrhoid ointments and petroleum jelly can also provide relief.   Add fiber to your diet   Adding fiber to your diet can help relieve constipation by making stools softer and easier to pass. To increase your fiber intake, your healthcare provider may recommend a bulking agent, such as psyllium. This is a high-fiber supplement available at most grocery stores and drugstores. Eating more fiber-rich foods will also help. There are two types of fiber:   ?? Insoluble fiber is the main ingredient in bulking agents. Its also found in foods such as wheat bran, whole-grain breads, fresh fruits, and vegetables.   ?? Soluble fiber is found in foods such as oat bran. Although soluble fiber is good for you, it may not ease constipation as much as foods high in insoluble fiber.   Drink more water   Along with a high-fiber diet, drinking more water can help ease constipation. This is because insoluble fiber absorbs water, making stools soft and bulky. Be sure to drink plenty of water throughout the day. Drinking fruit juices, such as prune juice or apple juice, can also help prevent constipation.   Get more exercise   Regular exercise aids digestion and helps prevent constipation. Its also great for your health. So talk with your healthcare provider about starting an exercise program. Low-impact activities, such as swimming or walking, are good places to start. Take it easy at first. And remember to drink plenty of water when you exercise.   High-fiber  foods   High-fiber foods offer many benefits. By making your stools softer, they help heal and prevent swollen hemorrhoids. They may also help reduce the risk of colon and rectal cancer. Best of all, theyre usually low in calories and taste great. Here are some examples of fiber-rich foods.   ?? Whole grains, such as wheat bran, corn bran, and brown rice.   ?? Vegetables, especially carrots, broccoli, cabbage, and peas.   ?? Fruits, such as apples, bananas, raisins, peaches, and pears.   ?? Nuts and legumes, especially peanuts, lentils, and kidney beans.   Easy ways to add fiber   The tips below offer some simple ways to add more high-fiber foods to your meals.   ?? Start your day with a high-fiber breakfast. Eat a wheat bran cereal along with a sliced banana. Or, try peanut butter on whole-wheat toast.   ?? Eat carrot sticks for snacks. Theyre easy to prepare, taste great, and are low in calories.   ?? Use whole-grain breads instead of white bread for sandwiches.   ?? Eat fruits for treats. Try an apple and some raisins instead of a candy bar.     ?? 2000-2017 [employer], [place] [address]. All rights reserved. This information is not intended as a substitute for professional medical care. Always follow your healthcare professional's instructions.                   After Monitored Anesthesia (MAC)  WHAT TO EXPECT AFTER THE PROCEDURE  After the procedure, it is typical to experience:  ???  Sleepiness.    HOME CARE FOR THE FIRST 24 HOURS AFTER GENERAL ANESTHESIA:   ?? Be sure your doctor knows what medications you take, especially any anti-inflammatory medication or blood thinners. This includes aspirin and any other over-the-counter medications, herbs, and supplements.  ?? Have an adult family member or friend drive you home after the procedure.  ?? Do not drive or use heavy equipment.  ?? Do not make important decisions or sign documents.  ?? Avoid alcohol.  ?? Have someone stay with you, if  possible.      SEEK MEDICAL CARE IF:  ?? Breathing problems  ?? Nausea and vomiting  ?? Rash    SEEK IMMEDIATE MEDICAL CARE IF:  ???  You have difficulty breathing.   ???  You have chest pain.  ???  You have any allergic problems.  This information is not intended to replace advice given to you by your health care provider. Make sure you discuss any questions you have with your health care provider.    ?? 2000-2015 [employer], [place] [address]. All rights reserved. This information is not intended as a substitute for professional medical care. Always follow your healthcare professional's instructions.       IS IT A STROKE?  Act FAST and Check for these signs:    FACE                          Does the face look uneven?    ARM                          Does one arm drift down?    SPEECH                     Does their speech sound strange?    TIME                          Call 9-1-1 at any sign of stroke  Heart Attack Signs  Chest discomfort: Most heart attacks involve discomfort in the center of the chest and lasts more than a few minutes, or goes away and comes back. It can feel like uncomfortable pressure, squeezing, fullness or pain.  Discomfort in upper body: Symptoms can include pain or discomfort in one or both arms, back, neck, jaw or stomach.  Shortness of breath: With or without discomfort.  Other signs: Breaking out in a cold sweat, nausea, or lightheaded.  Remember, MINUTES DO MATTER. If you experience any of these heart attack warning signs, call 9-1-1 to get immediate medical attention!     ---------------------------------------------------------------------------------------------------------------------  [facility] allows you to manage your health, view your test results, and retrieve your discharge documents from your hospital stay securely and conveniently from your computer.  To begin the enrollment process, visit [email or web address]. Click on ???Sign up now???  under [place].
